# Patient Record
Sex: Female | Born: 1945
Health system: Southern US, Community
[De-identification: ages and names within clinical notes are randomized; demographics above are authoritative.]

## PROBLEM LIST (undated history)

## (undated) DIAGNOSIS — F329 Major depressive disorder, single episode, unspecified: Secondary | ICD-10-CM

## (undated) DIAGNOSIS — F32A Depression, unspecified: Secondary | ICD-10-CM

## (undated) DIAGNOSIS — I10 Essential (primary) hypertension: Secondary | ICD-10-CM

## (undated) HISTORY — PX: BREAST EXCISIONAL BIOPSY: SUR124

---

## 1998-03-12 ENCOUNTER — Other Ambulatory Visit: Admission: RE | Admit: 1998-03-12 | Discharge: 1998-03-12 | Payer: Self-pay | Admitting: Gynecology

## 1999-05-25 ENCOUNTER — Other Ambulatory Visit: Admission: RE | Admit: 1999-05-25 | Discharge: 1999-05-25 | Payer: Self-pay | Admitting: Gynecology

## 2000-07-03 ENCOUNTER — Encounter: Admission: RE | Admit: 2000-07-03 | Discharge: 2000-07-03 | Payer: Self-pay | Admitting: Gynecology

## 2000-07-03 ENCOUNTER — Encounter: Payer: Self-pay | Admitting: Gynecology

## 2000-07-31 ENCOUNTER — Other Ambulatory Visit: Admission: RE | Admit: 2000-07-31 | Discharge: 2000-07-31 | Payer: Self-pay | Admitting: Gynecology

## 2001-07-19 ENCOUNTER — Encounter: Admission: RE | Admit: 2001-07-19 | Discharge: 2001-07-19 | Payer: Self-pay | Admitting: Gynecology

## 2001-07-19 ENCOUNTER — Encounter: Payer: Self-pay | Admitting: Gynecology

## 2001-08-30 ENCOUNTER — Other Ambulatory Visit: Admission: RE | Admit: 2001-08-30 | Discharge: 2001-08-30 | Payer: Self-pay | Admitting: Gynecology

## 2002-03-20 ENCOUNTER — Encounter (INDEPENDENT_AMBULATORY_CARE_PROVIDER_SITE_OTHER): Payer: Self-pay | Admitting: *Deleted

## 2002-03-20 ENCOUNTER — Ambulatory Visit (HOSPITAL_BASED_OUTPATIENT_CLINIC_OR_DEPARTMENT_OTHER): Admission: RE | Admit: 2002-03-20 | Discharge: 2002-03-20 | Payer: Self-pay | Admitting: General Surgery

## 2002-10-09 ENCOUNTER — Encounter: Admission: RE | Admit: 2002-10-09 | Discharge: 2002-10-09 | Payer: Self-pay | Admitting: Gynecology

## 2002-10-09 ENCOUNTER — Encounter: Payer: Self-pay | Admitting: Gynecology

## 2002-10-10 ENCOUNTER — Other Ambulatory Visit: Admission: RE | Admit: 2002-10-10 | Discharge: 2002-10-10 | Payer: Self-pay | Admitting: Obstetrics and Gynecology

## 2003-10-16 ENCOUNTER — Other Ambulatory Visit: Admission: RE | Admit: 2003-10-16 | Discharge: 2003-10-16 | Payer: Self-pay | Admitting: Gynecology

## 2003-10-17 ENCOUNTER — Encounter: Admission: RE | Admit: 2003-10-17 | Discharge: 2003-10-17 | Payer: Self-pay | Admitting: Gynecology

## 2004-11-17 ENCOUNTER — Encounter: Admission: RE | Admit: 2004-11-17 | Discharge: 2004-11-17 | Payer: Self-pay | Admitting: Gynecology

## 2004-11-30 ENCOUNTER — Other Ambulatory Visit: Admission: RE | Admit: 2004-11-30 | Discharge: 2004-11-30 | Payer: Self-pay | Admitting: Gynecology

## 2005-03-14 ENCOUNTER — Emergency Department (HOSPITAL_COMMUNITY): Admission: EM | Admit: 2005-03-14 | Discharge: 2005-03-14 | Payer: Self-pay | Admitting: *Deleted

## 2005-11-23 ENCOUNTER — Encounter: Admission: RE | Admit: 2005-11-23 | Discharge: 2005-11-23 | Payer: Self-pay | Admitting: Gynecology

## 2006-12-26 ENCOUNTER — Encounter: Admission: RE | Admit: 2006-12-26 | Discharge: 2006-12-26 | Payer: Self-pay | Admitting: Gynecology

## 2008-03-12 ENCOUNTER — Encounter: Admission: RE | Admit: 2008-03-12 | Discharge: 2008-03-12 | Payer: Self-pay | Admitting: Gynecology

## 2009-03-24 ENCOUNTER — Encounter: Admission: RE | Admit: 2009-03-24 | Discharge: 2009-03-24 | Payer: Self-pay | Admitting: Gynecology

## 2010-10-29 NOTE — Op Note (Signed)
   NAME:  Deborah Stevenson, Deborah Stevenson                         ACCOUNT NO.:  [id number]   MEDICAL RECORD NO.:  [id number]                   PATIENT TYPE:  AMB   LOCATION:  DSC                                  FACILITY:  [facility]   PHYSICIAN:  Gita Kudo, M.D.              DATE OF BIRTH:  [birth year]   DATE OF PROCEDURE:  03/20/2002  DATE OF DISCHARGE:                                 OPERATIVE REPORT   PREOPERATIVE DIAGNOSES:  Squamous cell cancer, left leg.   POSTOPERATIVE DIAGNOSES:  Squamous cell cancer, left leg.   OPERATION PERFORMED:  Wide excision of squamous cell carcinoma.   SURGEON:  Gita Kudo, M.D.   ANESTHESIA:  MAC--IV sedation, local 1% Xylocaine/Marcaine without  epinephrine.   INDICATIONS FOR PROCEDURE:  The patient is a 65 year old female who had  biopsy proven squamous cell carcinoma of her left leg.  It is about 1 cm in  size.  It needs wide excision.   OPERATIVE FINDINGS:  I marked out margins of 1 cm from the center of the  lesion in all directions.  Then the area was infiltrated with Xylocaine  Marcaine mixture.  Elliptical incision made in the long axis of the leg and  the dissection carried down through the fat but not the fascia of the  muscle.  Then the wound was closed with interrupted 3-0 and 4-0 nylon  sutures and sterile absorbent compressive dressing applied.  No  complications.  Specimen marked and sent for pathology.  The patient will be  discharged to be followed as an outpatient.  No complications.   DESCRIPTION OF PROCEDURE:  Gita Kudo, M.D.    MRL/MEDQ  D:  03/20/2002  T:  03/20/2002  Job:  [id number]   cc:   Hope M. Danella Deis, M.D.   Oley Balm Georgina Pillion, M.D.  [address]  [place]  [place] [address]  Fax: [phone]

## 2011-04-25 ENCOUNTER — Other Ambulatory Visit: Payer: Self-pay | Admitting: Gynecology

## 2011-04-25 DIAGNOSIS — Z1231 Encounter for screening mammogram for malignant neoplasm of breast: Secondary | ICD-10-CM

## 2011-05-17 ENCOUNTER — Ambulatory Visit
Admission: RE | Admit: 2011-05-17 | Discharge: 2011-05-17 | Disposition: A | Discharge status: Home / Self Care | Payer: Medicare Other | Source: Ambulatory Visit | Attending: Gynecology | Admitting: Gynecology

## 2011-05-17 DIAGNOSIS — Z1231 Encounter for screening mammogram for malignant neoplasm of breast: Secondary | ICD-10-CM

## 2014-10-06 ENCOUNTER — Other Ambulatory Visit: Payer: Self-pay

## 2014-10-06 DIAGNOSIS — Z1231 Encounter for screening mammogram for malignant neoplasm of breast: Secondary | ICD-10-CM

## 2014-10-23 ENCOUNTER — Ambulatory Visit
Admission: RE | Admit: 2014-10-23 | Discharge: 2014-10-23 | Disposition: A | Discharge status: Home / Self Care | Payer: Medicare Other | Source: Ambulatory Visit

## 2014-10-23 DIAGNOSIS — Z1231 Encounter for screening mammogram for malignant neoplasm of breast: Secondary | ICD-10-CM

## 2015-08-24 ENCOUNTER — Emergency Department (HOSPITAL_BASED_OUTPATIENT_CLINIC_OR_DEPARTMENT_OTHER): Payer: Medicare Other

## 2015-08-24 ENCOUNTER — Encounter (HOSPITAL_BASED_OUTPATIENT_CLINIC_OR_DEPARTMENT_OTHER): Payer: Self-pay | Admitting: *Deleted

## 2015-08-24 ENCOUNTER — Inpatient Hospital Stay (HOSPITAL_BASED_OUTPATIENT_CLINIC_OR_DEPARTMENT_OTHER)
Admission: EM | Admit: 2015-08-24 | Discharge: 2015-08-29 | DRG: 373 | Disposition: A | Discharge status: Home / Self Care | Payer: Medicare Other | Attending: General Surgery | Admitting: General Surgery

## 2015-08-24 DIAGNOSIS — K3533 Acute appendicitis with perforation and localized peritonitis, with abscess: Secondary | ICD-10-CM

## 2015-08-24 DIAGNOSIS — Z888 Allergy status to other drugs, medicaments and biological substances status: Secondary | ICD-10-CM | POA: Diagnosis not present

## 2015-08-24 DIAGNOSIS — F329 Major depressive disorder, single episode, unspecified: Secondary | ICD-10-CM | POA: Diagnosis present

## 2015-08-24 DIAGNOSIS — R1031 Right lower quadrant pain: Secondary | ICD-10-CM | POA: Diagnosis present

## 2015-08-24 DIAGNOSIS — I10 Essential (primary) hypertension: Secondary | ICD-10-CM | POA: Diagnosis present

## 2015-08-24 DIAGNOSIS — Z882 Allergy status to sulfonamides status: Secondary | ICD-10-CM | POA: Diagnosis not present

## 2015-08-24 DIAGNOSIS — L271 Localized skin eruption due to drugs and medicaments taken internally: Secondary | ICD-10-CM | POA: Diagnosis not present

## 2015-08-24 DIAGNOSIS — Z7982 Long term (current) use of aspirin: Secondary | ICD-10-CM

## 2015-08-24 DIAGNOSIS — T373X5A Adverse effect of other antiprotozoal drugs, initial encounter: Secondary | ICD-10-CM | POA: Diagnosis not present

## 2015-08-24 DIAGNOSIS — K353 Acute appendicitis with localized peritonitis, without perforation or gangrene: Secondary | ICD-10-CM

## 2015-08-24 DIAGNOSIS — K651 Peritoneal abscess: Secondary | ICD-10-CM

## 2015-08-24 DIAGNOSIS — Z79899 Other long term (current) drug therapy: Secondary | ICD-10-CM | POA: Diagnosis not present

## 2015-08-24 DIAGNOSIS — Y92230 Patient room in hospital as the place of occurrence of the external cause: Secondary | ICD-10-CM | POA: Diagnosis not present

## 2015-08-24 HISTORY — DX: Major depressive disorder, single episode, unspecified: F32.9

## 2015-08-24 HISTORY — DX: Depression, unspecified: F32.A

## 2015-08-24 HISTORY — DX: Essential (primary) hypertension: I10

## 2015-08-24 LAB — CBC
HCT: 37.7 % (ref 36.0–46.0)
Hemoglobin: 12.5 g/dL (ref 12.0–15.0)
MCH: 29.2 pg (ref 26.0–34.0)
MCHC: 33.2 g/dL (ref 30.0–36.0)
MCV: 88.1 fL (ref 78.0–100.0)
PLATELETS: 280 10*3/uL (ref 150–400)
RBC: 4.28 MIL/uL (ref 3.87–5.11)
RDW: 13.1 % (ref 11.5–15.5)
WBC: 11.8 10*3/uL — AB (ref 4.0–10.5)

## 2015-08-24 LAB — COMPREHENSIVE METABOLIC PANEL
ALBUMIN: 3.8 g/dL (ref 3.5–5.0)
ALT: 87 U/L — AB (ref 14–54)
AST: 75 U/L — AB (ref 15–41)
Alkaline Phosphatase: 153 U/L — ABNORMAL HIGH (ref 38–126)
Anion gap: 11 (ref 5–15)
BILIRUBIN TOTAL: 0.6 mg/dL (ref 0.3–1.2)
BUN: 12 mg/dL (ref 6–20)
CO2: 26 mmol/L (ref 22–32)
CREATININE: 0.76 mg/dL (ref 0.44–1.00)
Calcium: 9.1 mg/dL (ref 8.9–10.3)
Chloride: 101 mmol/L (ref 101–111)
GFR calc Af Amer: >60 mL/min (ref >60)
GLUCOSE: 111 mg/dL — AB (ref 65–99)
Potassium: 3.9 mmol/L (ref 3.5–5.1)
Sodium: 138 mmol/L (ref 135–145)
TOTAL PROTEIN: 7.4 g/dL (ref 6.5–8.1)

## 2015-08-24 LAB — URINALYSIS, ROUTINE W REFLEX MICROSCOPIC
BILIRUBIN URINE: NEGATIVE
GLUCOSE, UA: NEGATIVE mg/dL
Hgb urine dipstick: NEGATIVE
KETONES UR: NEGATIVE mg/dL
LEUKOCYTES UA: NEGATIVE
NITRITE: NEGATIVE
PH: 7.5 (ref 5.0–8.0)
PROTEIN: NEGATIVE mg/dL
Specific Gravity, Urine: 1.009 (ref 1.005–1.030)

## 2015-08-24 LAB — LIPASE, BLOOD: Lipase: 32 U/L (ref 11–51)

## 2015-08-24 MED ORDER — PIPERACILLIN-TAZOBACTAM 3.375 G IVPB 30 MIN
3.3750 g | Freq: Once | INTRAVENOUS | Status: AC
  Administered 2015-08-24: 3.375 g via INTRAVENOUS
  Filled 2015-08-24 (×2): qty 50

## 2015-08-24 MED ORDER — KCL IN DEXTROSE-NACL 20-5-0.45 MEQ/L-%-% IV SOLN
INTRAVENOUS | Status: DC
  Administered 2015-08-24: 1000 mL via INTRAVENOUS
  Administered 2015-08-25: 12:00:00 via INTRAVENOUS
  Administered 2015-08-26: 1000 mL via INTRAVENOUS
  Administered 2015-08-26 – 2015-08-27 (×3): via INTRAVENOUS
  Administered 2015-08-28: 1000 mL via INTRAVENOUS
  Filled 2015-08-24 (×9): qty 1000

## 2015-08-24 MED ORDER — ONDANSETRON HCL 4 MG/2ML IJ SOLN
INTRAMUSCULAR | Status: AC
  Filled 2015-08-24: qty 2

## 2015-08-24 MED ORDER — IOHEXOL 350 MG/ML SOLN
85.0000 mL | Freq: Once | INTRAVENOUS | Status: AC | PRN
  Administered 2015-08-24: 85 mL via INTRAVENOUS

## 2015-08-24 MED ORDER — HYDROCODONE-ACETAMINOPHEN 5-325 MG PO TABS
1.0000 | ORAL_TABLET | ORAL | Status: DC | PRN
  Administered 2015-08-26 (×2): 1 via ORAL
  Filled 2015-08-24 (×3): qty 1

## 2015-08-24 MED ORDER — ACETAMINOPHEN 650 MG RE SUPP: 650.0000 mg | Freq: Four times a day (QID) | RECTAL | Status: DC | PRN

## 2015-08-24 MED ORDER — PIPERACILLIN-TAZOBACTAM 3.375 G IVPB
3.3750 g | Freq: Three times a day (TID) | INTRAVENOUS | Status: DC
  Administered 2015-08-24 – 2015-08-28 (×11): 3.375 g via INTRAVENOUS
  Filled 2015-08-24 (×12): qty 50

## 2015-08-24 MED ORDER — SODIUM CHLORIDE 0.9 % IV BOLUS (SEPSIS)
500.0000 mL | Freq: Once | INTRAVENOUS | Status: AC
  Administered 2015-08-24: 500 mL via INTRAVENOUS

## 2015-08-24 MED ORDER — MORPHINE SULFATE (PF) 4 MG/ML IV SOLN
4.0000 mg | Freq: Once | INTRAVENOUS | Status: AC
  Administered 2015-08-24: 4 mg via INTRAVENOUS
  Filled 2015-08-24: qty 1

## 2015-08-24 MED ORDER — ONDANSETRON 4 MG PO TBDP: 4.0000 mg | ORAL_TABLET | Freq: Four times a day (QID) | ORAL | Status: DC | PRN

## 2015-08-24 MED ORDER — ACETAMINOPHEN 325 MG PO TABS: 650.0000 mg | ORAL_TABLET | Freq: Four times a day (QID) | ORAL | Status: DC | PRN

## 2015-08-24 MED ORDER — ONDANSETRON HCL 4 MG/2ML IJ SOLN: 4.0000 mg | Freq: Four times a day (QID) | INTRAMUSCULAR | Status: DC | PRN

## 2015-08-24 MED ORDER — HYDROMORPHONE HCL 1 MG/ML IJ SOLN
1.0000 mg | INTRAMUSCULAR | Status: DC | PRN
  Administered 2015-08-24 – 2015-08-25 (×2): 1 mg via INTRAVENOUS
  Filled 2015-08-24 (×2): qty 1

## 2015-08-24 MED ORDER — ONDANSETRON HCL 4 MG/2ML IJ SOLN
4.0000 mg | Freq: Once | INTRAMUSCULAR | Status: AC
Start: 2015-08-24 — End: 2015-08-24
  Administered 2015-08-24: 4 mg via INTRAVENOUS

## 2015-08-24 NOTE — ED Notes (Signed)
RLQ x 10 days.  Tenderness on palpation.  Denies N/V/D.  BM yesterday.

## 2015-08-24 NOTE — H&P (Signed)
Deborah Stevenson is an 70 y.o. female.    General Surgery [facility]  Chief Complaint: abdominal pain, probable perforated appendicitis with abscess  HPI: patient is a 70 yo WF with 10 day history of intermittent RLQ abd pain.  Denies fever, chills, nausea, or emesis.  Pain has become more persistent and patient presented today for evaluation to primary MD.  WBC slightly elevated at 11.8.  Sent to [facility] for CT scan which shows probable acute appendicitis with inflammation of the cecum and a 4-5 cm fluid collection in the RLQ.  Now admitted to general surgery for evaluation and management.  Previous C-section and breast biopsy (by [place]).  Past Medical History  Diagnosis Date  . Hypertension   . Depression     Past Surgical History  Procedure Laterality Date  . Cesarean section      History reviewed. No pertinent family history. Social History:  reports that she has never smoked. She does not have any smokeless tobacco history on file. She reports that she does not drink alcohol or use illicit drugs.  Allergies:  Allergies  Allergen Reactions  . Lamisil [Terbinafine] Hives  . Septra [Sulfamethoxazole-Trimethoprim] Hives and Nausea Only    fever    Medications Prior to Admission  Medication Sig Dispense Refill  . aspirin 81 MG tablet Take 81 mg by mouth daily.    . Biotin 5 MG CAPS Take 5 mg by mouth 2 (two) times daily.    . Calcium Citrate-Vitamin D 500-500 MG-UNIT CHEW Chew 500 mg by mouth 2 (two) times daily.    . Chondroitin Sulfate 150 MG CAPS Take 300 mg by mouth 2 (two) times daily.    Marland Kitchen co-enzyme Q-10 30 MG capsule Take 30 mg by mouth daily.    . Glucosamine 750 MG TABS Take 750 mg by mouth 2 (two) times daily.    . magnesium oxide (MAG-OX) 400 MG tablet Take 400 mg by mouth daily.    . metoprolol succinate (TOPROL-XL) 100 MG 24 hr tablet Take 100 mg by mouth daily. Take with or immediately following a meal.    . Omega-3 Fatty Acids (FISH  OIL) 1000 MG CAPS Take 1,000 mg by mouth daily.    . Probiotic Product (PROBIOTIC PO) Take 1 tablet by mouth daily.    Marland Kitchen pyridOXINE (VITAMIN B-6) 100 MG tablet Take 100 mg by mouth 2 (two) times daily.    . sertraline (ZOLOFT) 100 MG tablet Take 100 mg by mouth daily.      Results for orders placed or performed during the hospital encounter of 08/24/15 (from the past 48 hour(s))  Urinalysis, Routine w reflex microscopic (not at [facility])     Status: None   Collection Time: 08/24/15  2:45 PM  Result Value Ref Range   Color, Urine YELLOW YELLOW   APPearance CLEAR CLEAR   Specific Gravity, Urine 1.009 1.005 - 1.030   pH 7.5 5.0 - 8.0   Glucose, UA NEGATIVE NEGATIVE mg/dL   Hgb urine dipstick NEGATIVE NEGATIVE   Bilirubin Urine NEGATIVE NEGATIVE   Ketones, ur NEGATIVE NEGATIVE mg/dL   Protein, ur NEGATIVE NEGATIVE mg/dL   Nitrite NEGATIVE NEGATIVE   Leukocytes, UA NEGATIVE NEGATIVE    Comment: MICROSCOPIC NOT DONE ON URINES WITH NEGATIVE PROTEIN, BLOOD, LEUKOCYTES, NITRITE, OR GLUCOSE <1000 mg/dL.  Lipase, blood     Status: None   Collection Time: 08/24/15  2:50 PM  Result Value Ref Range   Lipase 32 11 -  51 U/L  Comprehensive metabolic panel     Status: Abnormal   Collection Time: 08/24/15  2:50 PM  Result Value Ref Range   Sodium 138 135 - 145 mmol/L   Potassium 3.9 3.5 - 5.1 mmol/L   Chloride 101 101 - 111 mmol/L   CO2 26 22 - 32 mmol/L   Glucose, Bld 111 (H) 65 - 99 mg/dL   BUN 12 6 - 20 mg/dL   Creatinine, Ser 1.25 0.44 - 1.00 mg/dL   Calcium 9.1 8.9 - 55.7 mg/dL   Total Protein 7.4 6.5 - 8.1 g/dL   Albumin 3.8 3.5 - 5.0 g/dL   AST 75 (H) 15 - 41 U/L   ALT 87 (H) 14 - 54 U/L   Alkaline Phosphatase 153 (H) 38 - 126 U/L   Total Bilirubin 0.6 0.3 - 1.2 mg/dL   GFR calc non Af Amer >60 >60 mL/min   GFR calc Af Amer >60 >60 mL/min    Comment: (NOTE) The eGFR has been calculated using the CKD EPI equation. This calculation has not been validated in all clinical  situations. eGFR's persistently <60 mL/min signify possible Chronic Kidney Disease.    Anion gap 11 5 - 15  CBC     Status: Abnormal   Collection Time: 08/24/15  2:50 PM  Result Value Ref Range   WBC 11.8 (H) 4.0 - 10.5 K/uL   RBC 4.28 3.87 - 5.11 MIL/uL   Hemoglobin 12.5 12.0 - 15.0 g/dL   HCT 83.0 52.8 - 18.8 %   MCV 88.1 78.0 - 100.0 fL   MCH 29.2 26.0 - 34.0 pg   MCHC 33.2 30.0 - 36.0 g/dL   RDW 57.7 11.0 - 78.9 %   Platelets 280 150 - 400 K/uL   Ct Abdomen Pelvis W Contrast  08/24/2015  CLINICAL DATA:  RIGHT lower quadrant pain and tenderness for 10 days, history hypertension EXAM: CT ABDOMEN AND PELVIS WITH CONTRAST TECHNIQUE: Multidetector CT imaging of the abdomen and pelvis was performed using the standard protocol following bolus administration of intravenous contrast. Sagittal and coronal MPR images reconstructed from axial data set. CONTRAST:  40mL OMNIPAQUE IOHEXOL 350 MG/ML SOLN IV. Dilute oral contrast. COMPARISON:  None FINDINGS: Minimal dependent atelectasis in both lower lobes. Tiny nonspecific low to intermediate attenuation foci in both kidneys, nonspecific. Liver, spleen, pancreas, kidneys, and adrenal glands otherwise normal appearance. Contracted gallbladder. Inflammatory process centered at the tip of the cecum where a probable enhancing thickened appendix is identified extending into a focal inflammatory collection measuring 4.4 x 2.7 x 3.2 cm in size likely perforated appendicitis with surrounding abscess. Mild thickening of the cecal tip and adjacent small bowel. No free intraperitoneal air or fluid identified. Bladder, ureters, uterus and ovaries unremarkable. Stomach and remaining bowel loops normal appearance. No mass, adenopathy, or acute bone lesion. Degenerative disc disease changes of the thoracolumbar spine. IMPRESSION: Inflammatory process at the tip of the cecum with identification of a probable thickened enhancing appendix extending into an inflammatory  collection most consistent with perforated appendicitis with a periappendiceal abscess of measuring 4.4 x 2.7 x 3.2 cm. Electronically Signed   By: Ulyses Southward M.D.   On: 08/24/2015 17:08    Review of Systems  Constitutional: Negative for fever, chills and diaphoresis.  HENT: Negative.   Eyes: Negative.   Respiratory: Negative.   Cardiovascular: Negative.   Gastrointestinal: Positive for abdominal pain (RLQ, sharp). Negative for nausea, vomiting, diarrhea and constipation.  Genitourinary: Negative.   Musculoskeletal: Negative.  Skin: Negative.   Neurological: Negative.  Negative for weakness.  Endo/Heme/Allergies: Negative.   Psychiatric/Behavioral: Negative.     Blood pressure 118/52, pulse 65, temperature 98.2 F (36.8 C), temperature source Oral, resp. rate 17, height '5\' 5"'$  (1.651 m), weight 68.04 kg (150 lb), SpO2 95 %. Physical Exam  Constitutional: She is oriented to person, place, and time. She appears well-developed and well-nourished. No distress.  HENT:  Head: Normocephalic and atraumatic.  Right Ear: External ear normal.  Left Ear: External ear normal.  Eyes: Conjunctivae are normal. Pupils are equal, round, and reactive to light. No scleral icterus.  Neck: Normal range of motion. Neck supple. No tracheal deviation present. No thyromegaly present.  Cardiovascular: Normal rate, regular rhythm and normal heart sounds.   No murmur heard. Respiratory: Effort normal and breath sounds normal. No respiratory distress. She has no wheezes.  GI: Soft. Bowel sounds are normal. She exhibits no distension and no mass. There is tenderness (RLQ). There is guarding.  Musculoskeletal: Normal range of motion. She exhibits no edema.  Neurological: She is alert and oriented to person, place, and time.  Skin: Skin is warm and dry. She is not diaphoretic.  Psychiatric: She has a normal mood and affect. Her behavior is normal.     Assessment/Plan Probable acute appendicitis with abscess  (appendiceal neoplasm less likely)  IV Zosyn started at [place]  Will allow clear liquids, NPO after midnight for possible IR drainage in AM 3/14  IV hydration  Discussed with patient and husband who agree with plan.  Earnstine Regal, MD, [facility] Office: [place], MD 08/24/2015, 10:06 PM

## 2015-08-24 NOTE — ED Provider Notes (Signed)
CSN: [id number]     Arrival date & time 08/24/15  1429 History  By signing my name below, I, Tanda Rockers, attest that this documentation has been prepared under the direction and in the presence of Rolan Bucco, MD. Electronically Signed: Tanda Rockers, ED Scribe. 08/24/2015. 4:15 PM.   Chief Complaint  Patient presents with  . Abdominal Pain   The history is provided by the patient. No language interpreter was used.     HPI Comments: Deborah Stevenson is a 70 y.o. female who presents to the Emergency Department complaining of gradual onset, constant, waxing and waning, RLQ abdominal pain x 10 days. The pain is not worsened after eating. Pt was seen at Urgent Care this morning and was sent here for further evaluation. She reports hx of chronic constipation/hard stools. Her last bowel movement was yesterday which she describes as large and hard with a small amount of diarrhea. Denies nausea, vomiting, fever, or any other associated symptoms. PSHx cesarean section and colonoscopy (7 years ago).   PCP - Dr. Laurine Blazer  Past Medical History  Diagnosis Date  . Hypertension   . Depression    Past Surgical History  Procedure Laterality Date  . Cesarean section     History reviewed. No pertinent family history. Social History  Substance Use Topics  . Smoking status: Never Smoker   . Smokeless tobacco: None  . Alcohol Use: No   OB History    No data available     Review of Systems  Constitutional: Negative for fever, chills, diaphoresis and fatigue.  HENT: Negative for congestion, rhinorrhea and sneezing.   Eyes: Negative.   Respiratory: Negative for cough, chest tightness and shortness of breath.   Cardiovascular: Negative for chest pain and leg swelling.  Gastrointestinal: Positive for abdominal pain and diarrhea. Negative for nausea, vomiting and blood in stool. Constipation: chronic.  Genitourinary: Negative for frequency, hematuria, flank pain and difficulty urinating.   Musculoskeletal: Negative for back pain and arthralgias.  Skin: Negative for rash.  Neurological: Negative for dizziness, speech difficulty, weakness, numbness and headaches.   Allergies  Lamisil and Septra  Home Medications   Prior to Admission medications   Medication Sig Start Date End Date Taking? Authorizing Provider  metoprolol succinate (TOPROL-XL) 100 MG 24 hr tablet Take 100 mg by mouth daily. Take with or immediately following a meal.   Yes Historical Provider, MD  sertraline (ZOLOFT) 100 MG tablet Take 100 mg by mouth daily.   Yes Historical Provider, MD   BP 139/68 mmHg  Pulse 72  Temp(Src) 98.8 F (37.1 C)  Resp 18  Ht  (1.651 m)  Wt 150 lb (68.04 kg)  BMI 24.96 kg/m2  SpO2 100%   Physical Exam  Constitutional: She is oriented to person, place, and time. She appears well-developed and well-nourished.  HENT:  Head: Normocephalic and atraumatic.  Eyes: Pupils are equal, round, and reactive to light.  Neck: Normal range of motion. Neck supple.  Cardiovascular: Normal rate, regular rhythm and normal heart sounds.   Pulmonary/Chest: Effort normal and breath sounds normal. No respiratory distress. She has no wheezes. She has no rales. She exhibits no tenderness.  Abdominal: Soft. Bowel sounds are normal. There is tenderness (Positive moderate tenderness of the right lower quadrant with guarding). There is no rebound and no guarding.  Musculoskeletal: Normal range of motion. She exhibits no edema.  Lymphadenopathy:    She has no cervical adenopathy.  Neurological: She is alert and oriented to person, place,  and time.  Skin: Skin is warm and dry. No rash noted.  Psychiatric: She has a normal mood and affect.    ED Course  Procedures (including critical care time)  DIAGNOSTIC STUDIES: Oxygen Saturation is 100% on RA, normal by my interpretation.    COORDINATION OF CARE: 4:10 PM-Discussed treatment plan which includes CT A/P with pt at bedside and pt agreed  to plan.   Labs Review Labs Reviewed  COMPREHENSIVE METABOLIC PANEL - Abnormal; Notable for the following:    Glucose, Bld 111 (*)    AST 75 (*)    ALT 87 (*)    Alkaline Phosphatase 153 (*)    All other components within normal limits  CBC - Abnormal; Notable for the following:    WBC 11.8 (*)    All other components within normal limits  LIPASE, BLOOD  URINALYSIS, ROUTINE W REFLEX MICROSCOPIC (NOT AT [facility])    Imaging Review Ct Abdomen Pelvis W Contrast  08/24/2015  CLINICAL DATA:  RIGHT lower quadrant pain and tenderness for 10 days, history hypertension EXAM: CT ABDOMEN AND PELVIS WITH CONTRAST TECHNIQUE: Multidetector CT imaging of the abdomen and pelvis was performed using the standard protocol following bolus administration of intravenous contrast. Sagittal and coronal MPR images reconstructed from axial data set. CONTRAST:  85mL OMNIPAQUE IOHEXOL 350 MG/ML SOLN IV. Dilute oral contrast. COMPARISON:  None FINDINGS: Minimal dependent atelectasis in both lower lobes. Tiny nonspecific low to intermediate attenuation foci in both kidneys, nonspecific. Liver, spleen, pancreas, kidneys, and adrenal glands otherwise normal appearance. Contracted gallbladder. Inflammatory process centered at the tip of the cecum where a probable enhancing thickened appendix is identified extending into a focal inflammatory collection measuring 4.4 x 2.7 x 3.2 cm in size likely perforated appendicitis with surrounding abscess. Mild thickening of the cecal tip and adjacent small bowel. No free intraperitoneal air or fluid identified. Bladder, ureters, uterus and ovaries unremarkable. Stomach and remaining bowel loops normal appearance. No mass, adenopathy, or acute bone lesion. Degenerative disc disease changes of the thoracolumbar spine. IMPRESSION: Inflammatory process at the tip of the cecum with identification of a probable thickened enhancing appendix extending into an inflammatory collection most consistent with  perforated appendicitis with a periappendiceal abscess of measuring 4.4 x 2.7 x 3.2 cm. Electronically Signed   By: Ulyses SouthwardMark  Boles M.D.   On: 08/24/2015 17:08   I have personally reviewed and evaluated these images and lab results as part of my medical decision-making.   EKG Interpretation None      MDM   Final diagnoses:  Acute appendicitis with localized peritonitis   Patient presents with worsening right lower quadrant pain over the last 10 days. CT scan shows acute appendicitis with adjacent abscess. Patient's vital signs are stable without suggestions of sepsis. I spoke with Dr. Gerrit FriendsGerkin who [facility]. Patient was started on Zosyn.  I personally performed the services described in this documentation, which was scribed in my presence.  The recorded information has been reviewed and considered.      Rolan BuccoMelanie Eveleen Mcnear, MD 08/24/15 [phone]

## 2015-08-25 LAB — SURGICAL PCR SCREEN
MRSA, PCR: NEGATIVE
Staphylococcus aureus: NEGATIVE

## 2015-08-25 MED ORDER — ENOXAPARIN SODIUM 40 MG/0.4ML ~~LOC~~ SOLN
40.0000 mg | Freq: Every day | SUBCUTANEOUS | Status: DC
  Administered 2015-08-25 – 2015-08-29 (×5): 40 mg via SUBCUTANEOUS
  Filled 2015-08-25 (×6): qty 0.4

## 2015-08-25 MED ORDER — SERTRALINE HCL 100 MG PO TABS
100.0000 mg | ORAL_TABLET | Freq: Every day | ORAL | Status: DC
  Administered 2015-08-25 – 2015-08-29 (×5): 100 mg via ORAL
  Filled 2015-08-25 (×5): qty 1

## 2015-08-25 MED ORDER — METOPROLOL SUCCINATE ER 50 MG PO TB24
50.0000 mg | ORAL_TABLET | Freq: Two times a day (BID) | ORAL | Status: DC
  Administered 2015-08-27: 50 mg via ORAL
  Filled 2015-08-25 (×8): qty 1

## 2015-08-25 NOTE — Progress Notes (Signed)
Subjective: She looks fine, still tender RLQ, anxious to get things done and move on.    Objective: Vital signs in last 24 hours: Temp:  [98.2 F (36.8 C)-100.2 F (37.9 C)] 100.2 F (37.9 C) (03/14 0609) Pulse Rate:  [64-80] 65 (03/14 0609) Resp:  [17-18] 18 (03/14 0609) BP: (99-145)/(52-81) 99/56 mmHg (03/14 0609) SpO2:  [93 %-100 %] 93 % (03/14 0609) Weight:  [68.04 kg (150 lb)] 68.04 kg (150 lb) (03/13 1439) Last BM Date: 08/23/15 240 PO recorded 400 urine,  NPO Temp 100.2 at 0300, nothing recorded since.   No labs this AM Radiology has evaluated and does not think this is an abscess yet. Intake/Output from previous day: 03/13 0701 - 03/14 0700 In: 971.7 [P.O.:240; I.V.:731.7] Out: 400 [Urine:400] Intake/Output this shift:    General appearance: alert, cooperative, no distress and sore RLQ Resp: clear to auscultation bilaterally GI: soft, sore RLQ.  Lab Results:   Recent Labs  08/24/15 1450  WBC 11.8*  HGB 12.5  HCT 37.7  PLT 280    BMET  Recent Labs  08/24/15 1450  NA 138  K 3.9  CL 101  CO2 26  GLUCOSE 111*  BUN 12  CREATININE 0.76  CALCIUM 9.1   PT/INR No results for input(s): LABPROT, INR in the last 72 hours.   Recent Labs Lab 08/24/15 1450  AST 75*  ALT 87*  ALKPHOS 153*  BILITOT 0.6  PROT 7.4  ALBUMIN 3.8     Lipase     Component Value Date/Time   LIPASE 32 08/24/2015 1450     Studies/Results: Ct Abdomen Pelvis W Contrast  08/24/2015  CLINICAL DATA:  RIGHT lower quadrant pain and tenderness for 10 days, history hypertension EXAM: CT ABDOMEN AND PELVIS WITH CONTRAST TECHNIQUE: Multidetector CT imaging of the abdomen and pelvis was performed using the standard protocol following bolus administration of intravenous contrast. Sagittal and coronal MPR images reconstructed from axial data set. CONTRAST:  85mL OMNIPAQUE IOHEXOL 350 MG/ML SOLN IV. Dilute oral contrast. COMPARISON:  None FINDINGS: Minimal dependent atelectasis in  both lower lobes. Tiny nonspecific low to intermediate attenuation foci in both kidneys, nonspecific. Liver, spleen, pancreas, kidneys, and adrenal glands otherwise normal appearance. Contracted gallbladder. Inflammatory process centered at the tip of the cecum where a probable enhancing thickened appendix is identified extending into a focal inflammatory collection measuring 4.4 x 2.7 x 3.2 cm in size likely perforated appendicitis with surrounding abscess. Mild thickening of the cecal tip and adjacent small bowel. No free intraperitoneal air or fluid identified. Bladder, ureters, uterus and ovaries unremarkable. Stomach and remaining bowel loops normal appearance. No mass, adenopathy, or acute bone lesion. Degenerative disc disease changes of the thoracolumbar spine. IMPRESSION: Inflammatory process at the tip of the cecum with identification of a probable thickened enhancing appendix extending into an inflammatory collection most consistent with perforated appendicitis with a periappendiceal abscess of measuring 4.4 x 2.7 x 3.2 cm. Electronically Signed   By: Ulyses SouthwardMark  Boles M.D.   On: 08/24/2015 17:08    Medications: . piperacillin-tazobactam (ZOSYN)  IV  3.375 g Intravenous 3 times per day   . dextrose 5 % and 0.45 % NaCl with KCl 20 mEq/L 1,000 mL (08/24/15 2232)   Prior to Admission medications   Medication Sig Start Date End Date Taking? Authorizing Provider  aspirin 81 MG tablet Take 81 mg by mouth daily.   Yes Historical Provider, MD  Biotin 5 MG CAPS Take 5 mg by mouth 2 (two) times daily.  Yes Historical Provider, MD  Calcium Citrate-Vitamin D 500-500 MG-UNIT CHEW Chew 500 mg by mouth 2 (two) times daily.   Yes Historical Provider, MD  Chondroitin Sulfate 150 MG CAPS Take 300 mg by mouth 2 (two) times daily.   Yes Historical Provider, MD  co-enzyme Q-10 30 MG capsule Take 30 mg by mouth daily.   Yes Historical Provider, MD  Glucosamine 750 MG TABS Take 750 mg by mouth 2 (two) times daily.    Yes Historical Provider, MD  magnesium oxide (MAG-OX) 400 MG tablet Take 400 mg by mouth daily.   Yes Historical Provider, MD  metoprolol succinate (TOPROL-XL) 100 MG 24 hr tablet Take 100 mg by mouth daily. Take with or immediately following a meal.   Yes Historical Provider, MD  Omega-3 Fatty Acids (FISH OIL) 1000 MG CAPS Take 1,000 mg by mouth daily.   Yes Historical Provider, MD  Probiotic Product (PROBIOTIC PO) Take 1 tablet by mouth daily.   Yes Historical Provider, MD  pyridOXINE (VITAMIN B-6) 100 MG tablet Take 100 mg by mouth 2 (two) times daily.   Yes Historical Provider, MD  sertraline (ZOLOFT) 100 MG tablet Take 100 mg by mouth daily.   Yes Historical Provider, MD    Assessment/Plan Probable acute appendicitis with abscess (appendiceal neoplasm less likely Hx of hypertension Hx of depression  Antibiotics:  Zosyn day 1.5 DVT:  SCD/Lovenox  Plan:  IR does not think this is currently is a good drainable site.  Continue IV antibiotics, I will put her on sips and ice chips.  Restart BB and Zoloft.  Recheck labs in AM/  Add lovenox        LOS: 1 day    Claramae Rigdon 08/25/2015

## 2015-08-25 NOTE — Progress Notes (Signed)
ANTICOAGULATION CONSULT NOTE - Initial Consult  Pharmacy Consult for enoxaparin Indication: VTE prophylaxis  Allergies  Allergen Reactions  . Lamisil [Terbinafine] Hives  . Septra [Sulfamethoxazole-Trimethoprim] Hives and Nausea Only    fever    Patient Measurements: Height: 5\' 5"  (165.1 cm) Weight: 150 lb (68.04 kg) IBW/kg (Calculated) : 57 Heparin Dosing Weight:   Vital Signs: Temp: 100.2 F (37.9 C) (03/14 0609) Temp Source: Oral (03/14 0609) BP: 99/56 mmHg (03/14 0609) Pulse Rate: 65 (03/14 0609)  Labs:  Recent Labs  08/24/15 1450  HGB 12.5  HCT 37.7  PLT 280  CREATININE 0.76    Estimated Creatinine Clearance: 58 mL/min (by C-G formula based on Cr of 0.76).   Medical History: Past Medical History  Diagnosis Date  . Hypertension   . Depression     Assessment: [age] YOF presents with abdominal pain and likely acute appendicitis w/ abscess.  No current plans for procedure per notes.  Renal: CrCl > 6130ml/min CBC: WNL  Goal of Therapy:  Anti-Xa level 0.3-0.6 units/ml 4hrs after LMWH dose given  Plan:   Enoxaparin 40mg  SQ q24h  Do not anticipate need for renal adjustment, sign-off  Weekly SCr  Juliette Alcideustin Zeigler, PharmD, BCPS.   Pager: [phone] 08/25/2015 10:51 AM

## 2015-08-26 LAB — BASIC METABOLIC PANEL
Anion gap: 7 (ref 5–15)
BUN: 7 mg/dL (ref 6–20)
CO2: 24 mmol/L (ref 22–32)
CREATININE: 0.89 mg/dL (ref 0.44–1.00)
Calcium: 8.3 mg/dL — ABNORMAL LOW (ref 8.9–10.3)
Chloride: 106 mmol/L (ref 101–111)
GFR calc Af Amer: >60 mL/min (ref >60)
GLUCOSE: 143 mg/dL — AB (ref 65–99)
POTASSIUM: 4.3 mmol/L (ref 3.5–5.1)
SODIUM: 137 mmol/L (ref 135–145)

## 2015-08-26 LAB — CBC
HCT: 28.3 % — ABNORMAL LOW (ref 36.0–46.0)
Hemoglobin: 9.4 g/dL — ABNORMAL LOW (ref 12.0–15.0)
MCH: 28.8 pg (ref 26.0–34.0)
MCHC: 33.2 g/dL (ref 30.0–36.0)
MCV: 86.8 fL (ref 78.0–100.0)
PLATELETS: 194 10*3/uL (ref 150–400)
RBC: 3.26 MIL/uL — AB (ref 3.87–5.11)
RDW: 13 % (ref 11.5–15.5)
WBC: 8.5 10*3/uL (ref 4.0–10.5)

## 2015-08-26 NOTE — Progress Notes (Signed)
Subjective: She is still very tender RLQ, no nausea or vomiting.  Enjoying clears.    Objective: Vital signs in last 24 hours: Temp:  [98.4 F (36.9 C)-99.1 F (37.3 C)] 98.7 F (37.1 C) (03/15 0615) Pulse Rate:  [60-71] 60 (03/15 0615) Resp:  [17-18] 18 (03/15 0615) BP: (99-105)/(45-53) 99/53 mmHg (03/15 0615) SpO2:  [97 %-99 %] 98 % (03/15 0615) Last BM Date: 08/23/15 840 PO  Diet: clears   2600 urine   Afebrile, VSS BP is rather low Labs OK WBC is down also Intake/Output from previous day: 03/14 0701 - 03/15 0700 In: 3210 [P.O.:840; I.V.:2370] Out: 2600 [Urine:2600] Intake/Output this shift:    General appearance: alert, cooperative and no distress GI: soft, still pretty tender RLQ.  Lab Results:   Recent Labs  08/24/15 1450 08/26/15 0439  WBC 11.8* 8.5  HGB 12.5 9.4*  HCT 37.7 28.3*  PLT 280 194    BMET  Recent Labs  08/24/15 1450 08/26/15 0439  NA 138 137  K 3.9 4.3  CL 101 106  CO2 26 24  GLUCOSE 111* 143*  BUN 12 7  CREATININE 0.76 0.89  CALCIUM 9.1 8.3*   PT/INR No results for input(s): LABPROT, INR in the last 72 hours.   Recent Labs Lab 08/24/15 1450  AST 75*  ALT 87*  ALKPHOS 153*  BILITOT 0.6  PROT 7.4  ALBUMIN 3.8     Lipase     Component Value Date/Time   LIPASE 32 08/24/2015 1450     Studies/Results: Ct Abdomen Pelvis W Contrast  08/24/2015  CLINICAL DATA:  RIGHT lower quadrant pain and tenderness for 10 days, history hypertension EXAM: CT ABDOMEN AND PELVIS WITH CONTRAST TECHNIQUE: Multidetector CT imaging of the abdomen and pelvis was performed using the standard protocol following bolus administration of intravenous contrast. Sagittal and coronal MPR images reconstructed from axial data set. CONTRAST:  85mL OMNIPAQUE IOHEXOL 350 MG/ML SOLN IV. Dilute oral contrast. COMPARISON:  None FINDINGS: Minimal dependent atelectasis in both lower lobes. Tiny nonspecific low to intermediate attenuation foci in both kidneys,  nonspecific. Liver, spleen, pancreas, kidneys, and adrenal glands otherwise normal appearance. Contracted gallbladder. Inflammatory process centered at the tip of the cecum where a probable enhancing thickened appendix is identified extending into a focal inflammatory collection measuring 4.4 x 2.7 x 3.2 cm in size likely perforated appendicitis with surrounding abscess. Mild thickening of the cecal tip and adjacent small bowel. No free intraperitoneal air or fluid identified. Bladder, ureters, uterus and ovaries unremarkable. Stomach and remaining bowel loops normal appearance. No mass, adenopathy, or acute bone lesion. Degenerative disc disease changes of the thoracolumbar spine. IMPRESSION: Inflammatory process at the tip of the cecum with identification of a probable thickened enhancing appendix extending into an inflammatory collection most consistent with perforated appendicitis with a periappendiceal abscess of measuring 4.4 x 2.7 x 3.2 cm. Electronically Signed   By: Ulyses SouthwardMark  Boles M.D.   On: 08/24/2015 17:08    Medications: . enoxaparin (LOVENOX) injection  40 mg Subcutaneous Daily  . metoprolol succinate  50 mg Oral Q12H  . piperacillin-tazobactam (ZOSYN)  IV  3.375 g Intravenous 3 times per day  . sertraline  100 mg Oral Daily    Assessment/Plan Probable acute appendicitis with abscess (appendiceal neoplasm less likely) Hx of hypertension Hx of depression  Antibiotics: Zosyn day 2.5 DVT: SCD/Lovenox  Plan:  Toprol held yesterday due to BP, continue clears for now.  Continue antibiotics.      LOS: 2 days  Lunette Tapp 08/26/2015

## 2015-08-27 NOTE — Progress Notes (Signed)
  Subjective: Not really any better than at admission, but no worse.  She was sleeping well and is comfortable sitting still in bed.    Objective: Vital signs in last 24 hours: Temp:  [98 F (36.7 C)-99.1 F (37.3 C)] 99.1 F (37.3 C) (03/16 0453) Pulse Rate:  [62-71] 71 (03/16 0453) Resp:  [16-18] 18 (03/16 0453) BP: (108-143)/(52-69) 112/52 mmHg (03/16 0453) SpO2:  [97 %-98 %] 97 % (03/16 0453) Last BM Date: 08/23/15 360 PO recorded  Diet: clears Urine 3850 Afebrile, VSS Labs OK Intake/Output from previous day: 03/15 0701 - 03/16 0700 In: 2659.6 [P.O.:360; I.V.:1899.6; IV Piggyback:400] Out: 3850 [Urine:3850] Intake/Output this shift:    General appearance: alert, cooperative and no distress GI: soft, tender RLQ, no real change from yesterday  Lab Results:   Recent Labs  08/24/15 1450 08/26/15 0439  WBC 11.8* 8.5  HGB 12.5 9.4*  HCT 37.7 28.3*  PLT 280 194    BMET  Recent Labs  08/24/15 1450 08/26/15 0439  NA 138 137  K 3.9 4.3  CL 101 106  CO2 26 24  GLUCOSE 111* 143*  BUN 12 7  CREATININE 0.76 0.89  CALCIUM 9.1 8.3*   PT/INR No results for input(s): LABPROT, INR in the last 72 hours.   Recent Labs Lab 08/24/15 1450  AST 75*  ALT 87*  ALKPHOS 153*  BILITOT 0.6  PROT 7.4  ALBUMIN 3.8     Lipase     Component Value Date/Time   LIPASE 32 08/24/2015 1450     Studies/Results: No results found.  Medications: . enoxaparin (LOVENOX) injection  40 mg Subcutaneous Daily  . metoprolol succinate  50 mg Oral Q12H  . piperacillin-tazobactam (ZOSYN)  IV  3.375 g Intravenous 3 times per day  . sertraline  100 mg Oral Daily    Assessment/Plan Probable acute appendicitis with abscess (appendiceal neoplasm less likely) Hx of hypertension Hx of depression  Antibiotics: Zosyn day 4 DVT: SCD/Lovenox   Plan: continue antibiotics and IV fluids, clears.  We will get a CT later today and recheck labs in the AM.    LOS: 3 days     Deborah Stevenson 08/27/2015

## 2015-08-27 NOTE — Care Management Important Message (Signed)
Important Message  Patient Details  Name: Deborah Stevenson MRN: [id number] Date of Birth: 06/15/1945   Medicare Important Message Given:  Yes    Haskell FlirtJamison, Layson Bertsch 08/27/2015, 9:16 AMImportant Message  Patient Details  Name: Deborah Stevenson MRN: [id number] Date of Birth: 11/21/1945   Medicare Important Message Given:  Yes    Haskell FlirtJamison, Adelayde Minney 08/27/2015, 9:16 AM

## 2015-08-28 LAB — CBC
HEMATOCRIT: 30.5 % — AB (ref 36.0–46.0)
HEMOGLOBIN: 10.3 g/dL — AB (ref 12.0–15.0)
MCH: 28.8 pg (ref 26.0–34.0)
MCHC: 33.8 g/dL (ref 30.0–36.0)
MCV: 85.2 fL (ref 78.0–100.0)
Platelets: 233 10*3/uL (ref 150–400)
RBC: 3.58 MIL/uL — AB (ref 3.87–5.11)
RDW: 12.8 % (ref 11.5–15.5)
WBC: 4.9 10*3/uL (ref 4.0–10.5)

## 2015-08-28 LAB — BASIC METABOLIC PANEL
ANION GAP: 9 (ref 5–15)
CHLORIDE: 108 mmol/L (ref 101–111)
CO2: 23 mmol/L (ref 22–32)
Calcium: 8.8 mg/dL — ABNORMAL LOW (ref 8.9–10.3)
Creatinine, Ser: 0.73 mg/dL (ref 0.44–1.00)
GFR calc Af Amer: >60 mL/min (ref >60)
GLUCOSE: 112 mg/dL — AB (ref 65–99)
POTASSIUM: 4.3 mmol/L (ref 3.5–5.1)
Sodium: 140 mmol/L (ref 135–145)

## 2015-08-28 MED ORDER — CIPROFLOXACIN HCL 500 MG PO TABS
500.0000 mg | ORAL_TABLET | Freq: Two times a day (BID) | ORAL | Status: DC
  Administered 2015-08-28 – 2015-08-29 (×3): 500 mg via ORAL
  Filled 2015-08-28 (×5): qty 1

## 2015-08-28 MED ORDER — METRONIDAZOLE 500 MG PO TABS
500.0000 mg | ORAL_TABLET | Freq: Four times a day (QID) | ORAL | Status: DC
  Administered 2015-08-28 (×3): 500 mg via ORAL
  Filled 2015-08-28 (×8): qty 1

## 2015-08-28 MED ORDER — METOPROLOL TARTRATE 12.5 MG HALF TABLET
12.5000 mg | ORAL_TABLET | Freq: Two times a day (BID) | ORAL | Status: DC
  Administered 2015-08-28 – 2015-08-29 (×3): 12.5 mg via ORAL
  Filled 2015-08-28 (×4): qty 1

## 2015-08-28 NOTE — Progress Notes (Signed)
  Subjective: She is still a little tender RLQ, but much better.    Objective: Vital signs in last 24 hours: Temp:  [97.7 F (36.5 C)-99.1 F (37.3 C)] 97.7 F (36.5 C) (03/17 0506) Pulse Rate:  [61-70] 61 (03/17 0506) Resp:  [16-18] 16 (03/17 0506) BP: (96-133)/(56-73) 126/70 mmHg (03/17 0506) SpO2:  [98 %-100 %] 98 % (03/17 0506) Last BM Date: 08/27/15 240 PO recorded   Full liquids   Urine 3800 BM x 2 Afebrile, VSS  - she has only got one dose of the BB here so far Labs look good, WBC down to 4.9 Intake/Output from previous day: 03/16 0701 - 03/17 0700 In: 2043.8 [P.O.:240; I.V.:1753.8; IV Piggyback:50] Out: 3800 [Urine:3800] Intake/Output this shift:    General appearance: alert, cooperative and no distress GI: soft, still a little tender RLQ but better much better than yesterday when I saw her.  Lab Results:   Recent Labs  08/26/15 0439 08/28/15 0407  WBC 8.5 4.9  HGB 9.4* 10.3*  HCT 28.3* 30.5*  PLT 194 233    BMET  Recent Labs  08/26/15 0439 08/28/15 0407  NA 137 140  K 4.3 4.3  CL 106 108  CO2 24 23  GLUCOSE 143* 112*  BUN 7 <5*  CREATININE 0.89 0.73  CALCIUM 8.3* 8.8*   PT/INR No results for input(s): LABPROT, INR in the last 72 hours.   Recent Labs Lab 08/24/15 1450  AST 75*  ALT 87*  ALKPHOS 153*  BILITOT 0.6  PROT 7.4  ALBUMIN 3.8     Lipase     Component Value Date/Time   LIPASE 32 08/24/2015 1450     Studies/Results: No results found.  Medications: . enoxaparin (LOVENOX) injection  40 mg Subcutaneous Daily  . metoprolol succinate  50 mg Oral Q12H  . piperacillin-tazobactam (ZOSYN)  IV  3.375 g Intravenous 3 times per day  . sertraline  100 mg Oral Daily    Assessment/Plan Probable acute appendicitis with abscess (appendiceal neoplasm less likely) Hx of hypertension Hx of depression  Antibiotics: Zosyn day 4 DVT: SCD/Lovenox     Plan:  I have decreased her BB to 1/4 her home dose just to keep her on the  BB. Her pain is much better, and tolerating full liquids well.  I will check on increasing diet and when we need to consider interval appendectomy.      LOS: 4 days    Jhayden Demuro 08/28/2015

## 2015-08-28 NOTE — Care Management Note (Signed)
Case Management Note  Patient Details  Name: Deborah Stevenson MRN: [id number] Date of Birth: 09/29/1945  Subjective/Objective:    Admitted with Probable acute appendicitis with abscess                 Action/Plan: Discharge planning, no HH needs identified, patient has PCP to follow up with regarding HTN issues.   Expected Discharge Date:                  Expected Discharge Plan:  Home/Self Care  In-House Referral:  NA  Discharge planning Services  CM Consult  Post Acute Care Choice:  NA Choice offered to:  NA  DME Arranged:  N/A DME Agency:  NA  HH Arranged:  NA HH Agency:  NA  Status of Service:  Completed, signed off  Medicare Important Message Given:  Yes Date Medicare IM Given:    Medicare IM give by:    Date Additional Medicare IM Given:    Additional Medicare Important Message give by:     If discussed at Long Length of Stay Meetings, dates discussed:    Additional Comments:  Alexis Goodelleele, Lynda Wanninger K, RN 08/28/2015, 11:18 AM [phone]

## 2015-08-29 MED ORDER — DIPHENHYDRAMINE HCL 25 MG PO CAPS
25.0000 mg | ORAL_CAPSULE | ORAL | Status: DC | PRN
  Filled 2015-08-29: qty 1

## 2015-08-29 MED ORDER — AMOXICILLIN-POT CLAVULANATE 875-125 MG PO TABS: 1.0000 | ORAL_TABLET | Freq: Two times a day (BID) | ORAL | Status: AC

## 2015-08-29 NOTE — Progress Notes (Signed)
Patient ID: Deborah Stevenson, female   DOB: 06/21/1945, 70 y.o.   MRN: [id number]    Subjective: Had itching  And slight rash on legs last night about an hour after taking Flagyl. Resolved with Benadryl. No complaints this morning.  Denies abdominal pain  Or nausea. Tolerating diet.  Objective: Vital signs in last 24 hours: Temp:  [97.6 F (36.4 C)-98.8 F (37.1 C)] 98.6 F (37 C) (03/18 0509) Pulse Rate:  [63-70] 65 (03/18 0509) Resp:  [16] 16 (03/18 0509) BP: (120-124)/(54-79) 120/67 mmHg (03/18 0509) SpO2:  [95 %-98 %] 95 % (03/18 0509) Last BM Date: 08/27/15  Intake/Output from previous day: 03/17 0701 - 03/18 0700 In: 760.5 [P.O.:240; I.V.:520.5] Out: 2450 [Urine:2450] Intake/Output this shift:    General appearance: alert, cooperative and no distress GI: normal findings: soft, non-tender and no mass palpable  Lab Results:   Recent Labs  08/28/15 0407  WBC 4.9  HGB 10.3*  HCT 30.5*  PLT 233   BMET  Recent Labs  08/28/15 0407  NA 140  K 4.3  CL 108  CO2 23  GLUCOSE 112*  BUN <5*  CREATININE 0.73  CALCIUM 8.8*     Studies/Results: No results found.  Anti-infectives: Anti-infectives    Start     Dose/Rate Route Frequency Ordered Stop   08/28/15 1200  ciprofloxacin (CIPRO) tablet 500 mg     500 mg Oral 2 times daily 08/28/15 1111     08/28/15 1200  metroNIDAZOLE (FLAGYL) tablet 500 mg     500 mg Oral 4 times per day 08/28/15 1111     08/24/15 2230  piperacillin-tazobactam (ZOSYN) IVPB 3.375 g  Status:  Discontinued     3.375 g 12.5 mL/hr over 240 Minutes Intravenous 3 times per day 08/24/15 2215 08/28/15 1111   08/24/15 1900  piperacillin-tazobactam (ZOSYN) IVPB 3.375 g     3.375 g 100 mL/hr over 30 Minutes Intravenous  Once 08/24/15 1852 08/24/15 2028      Assessment/Plan: Appendicitis with phlegmon.  Treated nonoperatively with IV antibiotics with improvement. Okay for discharge. We'll switch to Augmentin orally due to reaction to  Flagyl. Follow-up in 2 weeks with repeat CT scan.     LOS: 5 days    Apoorva Bugay T 08/29/2015

## 2015-08-29 NOTE — Discharge Instructions (Signed)
[facility] ______[facility] INSTRUCTIONS Take antibiotics until prescription complete.  Start regular diet gradually. No activity limitations.   1. Fever over 101.0 2. Increasing abdominal pain  The clinic staff is available to answer your questions during regular business hours.  Please dont hesitate to call and ask to speak to one of the nurses for clinical concerns.  If you have a medical emergency, go to the nearest emergency room or call 911.  A surgeon from [facility] is always on call at the hospital. [address], [address], [place], [place]  [address] ? [address] [address], [place], [place]   [address] [phone] ? [phone] ? FAX [phone] Web site: [email or web address]

## 2015-08-29 NOTE — Discharge Summary (Signed)
   Patient ID: Deborah Stevenson [id number] 70 y.o. 03/03/1946  08/24/2015  Discharge date and time: 08/29/2015   Admitting Physician: Lendon Kahomas, Alecia  Discharge Physician: Glenna FellowsHOXWORTH,Godwin Tedesco T  Admission Diagnoses: Acute appendicitis with localized peritonitis [K35.3]  Discharge Diagnoses: same  Operations: None  Admission Condition: fair  Discharged Condition: good  Indication for Admission: Patient is a 70 year old female admitted with lower abdominal pain. Workup included a CT scan showing evidence of complicated appendicitiswith phlegmon. With this finding  Decision was made to treat nonoperatively with antibiotics.  She was treated initially with IV  Zosyn and later switched to IV Cipro plus Flagyl.  Hospital Course: She steadily improved with this course of treatment with  reducedpain and tenderness. White blood count normalized.Diet was able to be advanced. On the day of discharge she has no abdominal pain. Abdomen is nontender. No palpable mass. Normal white blood count. She did have an apparent allergic reaction to Flagyl  And will be discharged home on oral Augmentin.   Treatments: antibiotics: Zosyn  Disposition: Home  Patient Instructions:    Medication List    TAKE these medications        amoxicillin-clavulanate 875-125 MG tablet  Commonly known as:  AUGMENTIN  Take 1 tablet by mouth 2 (two) times daily.     aspirin 81 MG tablet  Take 81 mg by mouth daily.     Biotin 5 MG Caps  Take 5 mg by mouth 2 (two) times daily.     Calcium Citrate-Vitamin D 500-500 MG-UNIT Chew  Chew 500 mg by mouth 2 (two) times daily.     Chondroitin Sulfate 150 MG Caps  Take 300 mg by mouth 2 (two) times daily.     co-enzyme Q-10 30 MG capsule  Take 30 mg by mouth daily.     Fish Oil 1000 MG Caps  Take 1,000 mg by mouth daily.     Glucosamine 750 MG Tabs  Take 750 mg by mouth 2 (two) times daily.     magnesium oxide 400 MG tablet  Commonly known as:  MAG-OX  Take 400  mg by mouth daily.     metoprolol succinate 100 MG 24 hr tablet  Commonly known as:  TOPROL-XL  Take 100 mg by mouth daily. Take with or immediately following a meal.     PROBIOTIC PO  Take 1 tablet by mouth daily.     pyridOXINE 100 MG tablet  Commonly known as:  VITAMIN B-6  Take 100 mg by mouth 2 (two) times daily.     sertraline 100 MG tablet  Commonly known as:  ZOLOFT  Take 100 mg by mouth daily.        Activity: activity as tolerated Diet: regular diet Wound Care: none needed  Follow-up:  With Dr. Maisie Fushomas in 2 weeks.  Repeat CT scan planned  At that time.  Signed: Mariella SaaBenjamin T Dishon Kehoe MD, FACS  08/29/2015, 9:23 AM

## 2015-08-29 NOTE — Progress Notes (Signed)
Patient had an episode of reaction to some medication, flagyl or cipro.  Woke up around 0400, with itching and welts on rashes on her arms, back, abdomen, and legs.  We called MD but no call back received. After an hour patient went to sleep.  Patient did not have any further complaints at this time.

## 2015-09-17 ENCOUNTER — Other Ambulatory Visit: Payer: Self-pay | Admitting: General Surgery

## 2015-09-17 DIAGNOSIS — K3532 Acute appendicitis with perforation and localized peritonitis, without abscess: Secondary | ICD-10-CM

## 2015-09-24 ENCOUNTER — Ambulatory Visit
Admission: RE | Admit: 2015-09-24 | Discharge: 2015-09-24 | Disposition: A | Discharge status: Home / Self Care | Payer: Medicare Other | Source: Ambulatory Visit | Attending: General Surgery | Admitting: General Surgery

## 2015-09-24 DIAGNOSIS — K3532 Acute appendicitis with perforation and localized peritonitis, without abscess: Secondary | ICD-10-CM

## 2015-09-24 MED ORDER — IOPAMIDOL (ISOVUE-300) INJECTION 61%
100.0000 mL | Freq: Once | INTRAVENOUS | Status: AC | PRN
  Administered 2015-09-24: 100 mL via INTRAVENOUS

## 2015-10-12 ENCOUNTER — Other Ambulatory Visit: Payer: Self-pay

## 2015-10-12 DIAGNOSIS — Z1231 Encounter for screening mammogram for malignant neoplasm of breast: Secondary | ICD-10-CM

## 2015-11-23 ENCOUNTER — Ambulatory Visit
Admission: RE | Admit: 2015-11-23 | Discharge: 2015-11-23 | Disposition: A | Discharge status: Home / Self Care | Payer: Medicare Other | Source: Ambulatory Visit

## 2015-11-23 DIAGNOSIS — Z1231 Encounter for screening mammogram for malignant neoplasm of breast: Secondary | ICD-10-CM

## 2017-05-06 IMAGING — CT CT ABD-PELV W/ CM
2 of 5 series · 16 of 46 positions shown, 18 images · IV contrast (APPLIED)
Comparison: None

CLINICAL DATA: RIGHT lower quadrant pain and tenderness for 10
days, history hypertension

EXAM:
CT ABDOMEN AND PELVIS WITH CONTRAST
TECHNIQUE: Multidetector CT imaging of the abdomen and pelvis was performed
using the standard protocol following bolus administration of
intravenous contrast. Sagittal and coronal MPR images reconstructed
from axial data set.
CONTRAST:  85mL OMNIPAQUE IOHEXOL 350 MG/ML SOLN IV. Dilute oral
contrast.

[Series 2: axial st · axial · 0.91mm/px · z∈[-464,-64]mm · 13 of 92 slices shown, 15 images]
[im 6/92  soft-tissue]
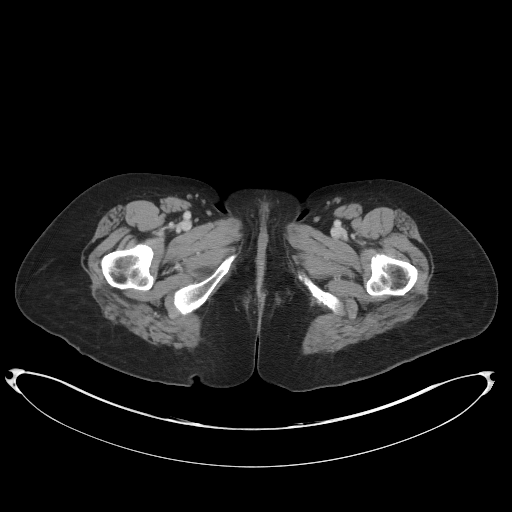
[im 6/92  bone]
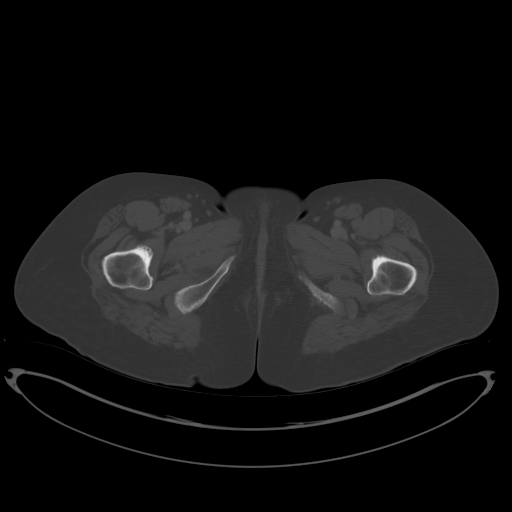
[im 11/92  soft-tissue]
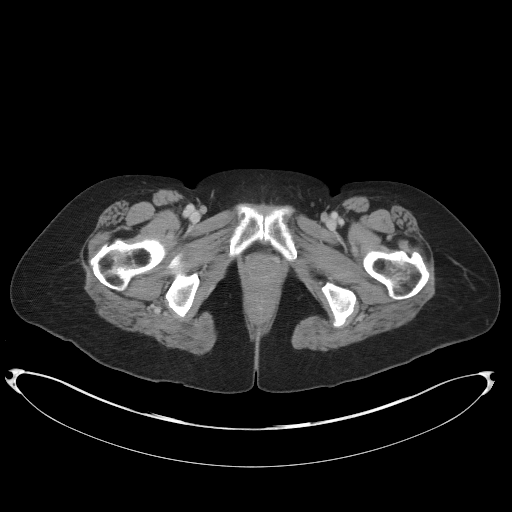
[im 21/92  soft-tissue]
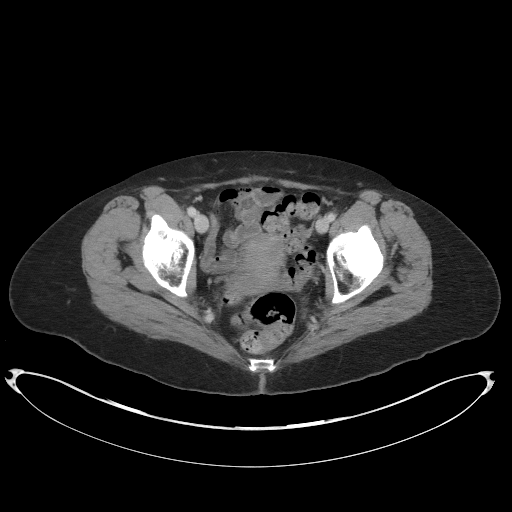
[im 26/92  soft-tissue]
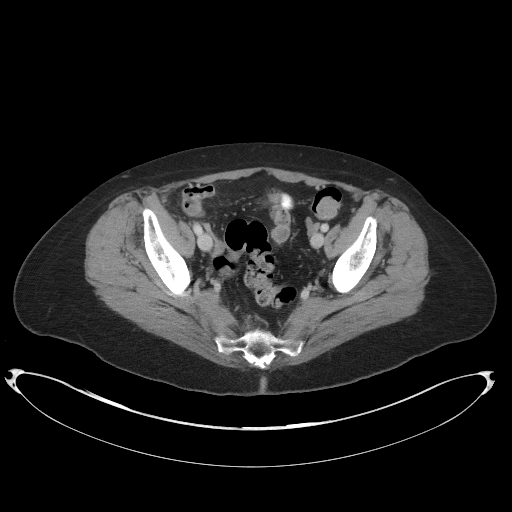
[im 31/92  soft-tissue]
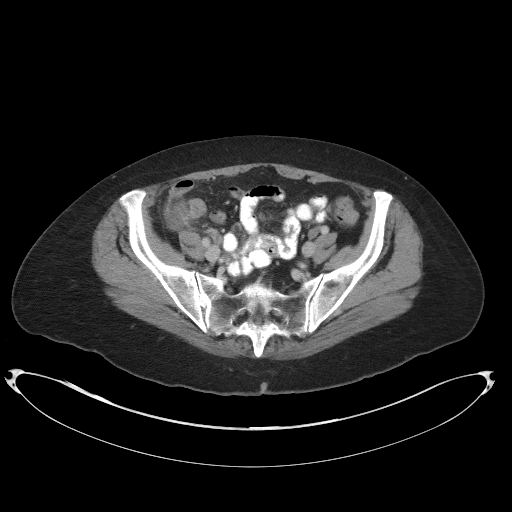
[im 41/92  soft-tissue]
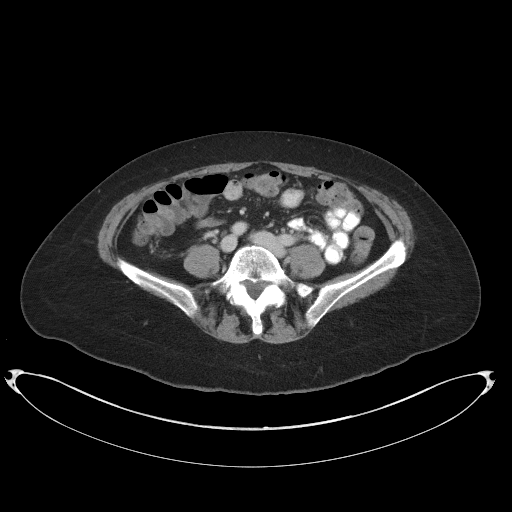
[im 46/92  soft-tissue]
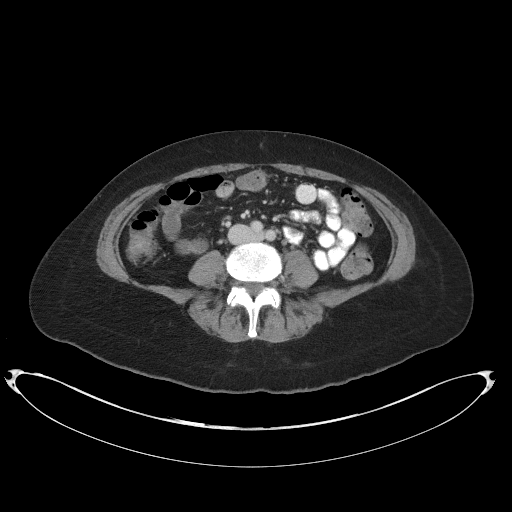
[im 51/92  soft-tissue]
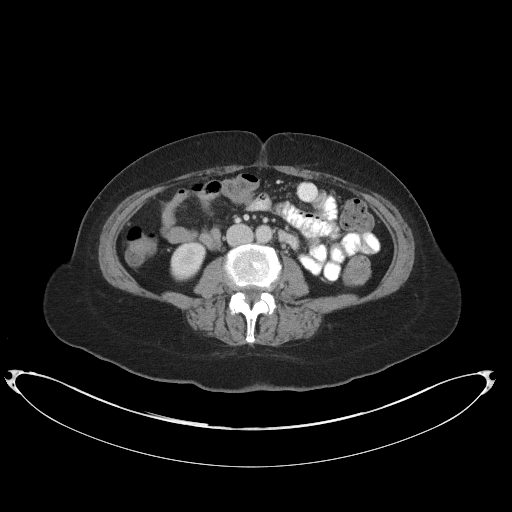
[im 61/92  soft-tissue]
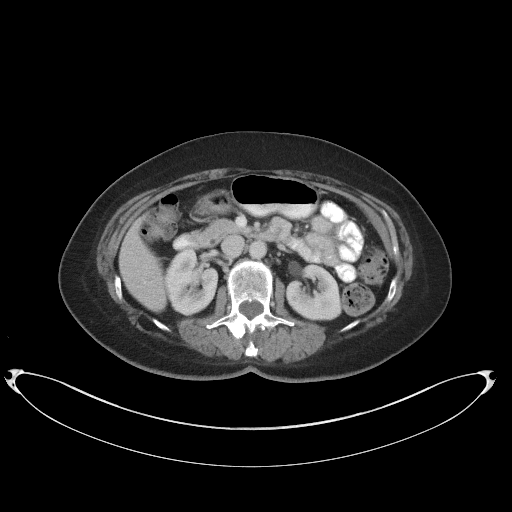
[im 61/92  bone]
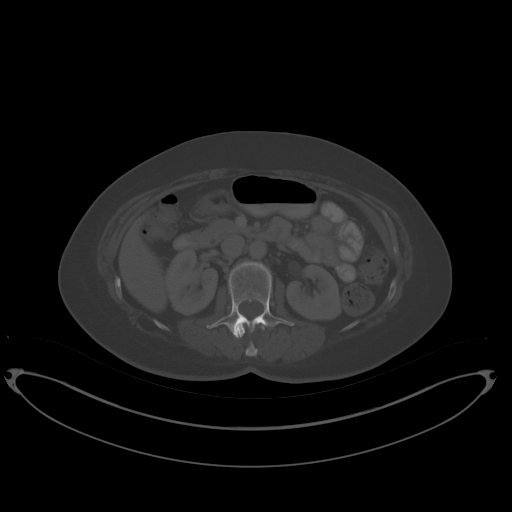
[im 66/92  soft-tissue]
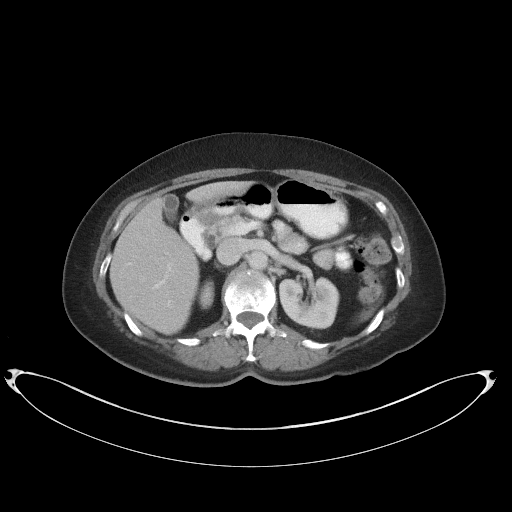
[im 71/92  soft-tissue]
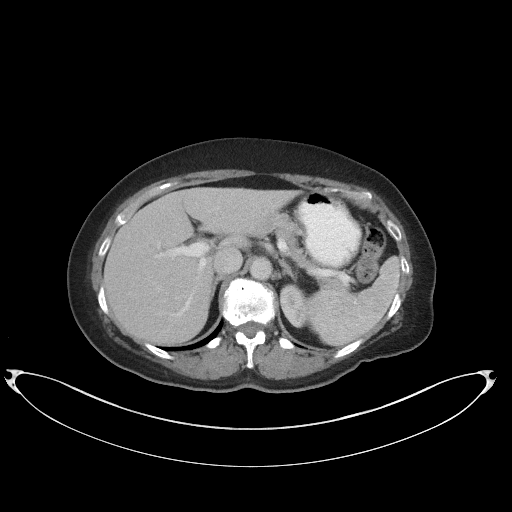
[im 81/92  soft-tissue]
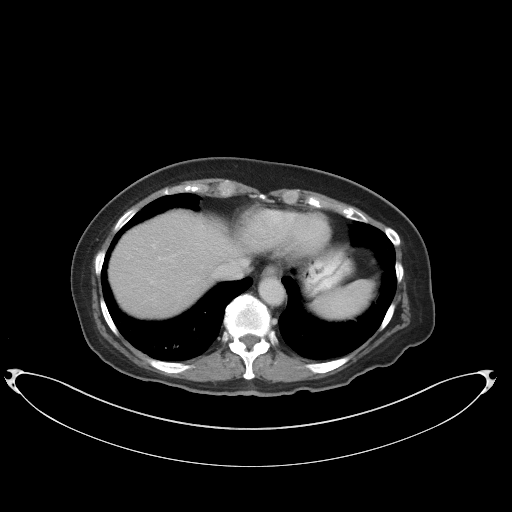
[im 86/92  soft-tissue]
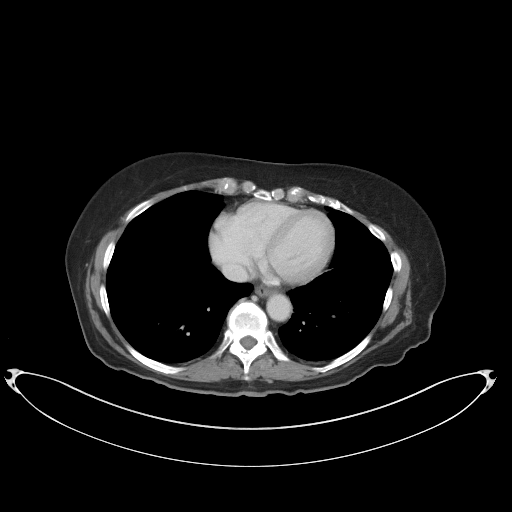

[Series 5: coronal st · coronal · 0.74mm/px · 3 of 79 slices shown]
[im 27/79  soft-tissue]
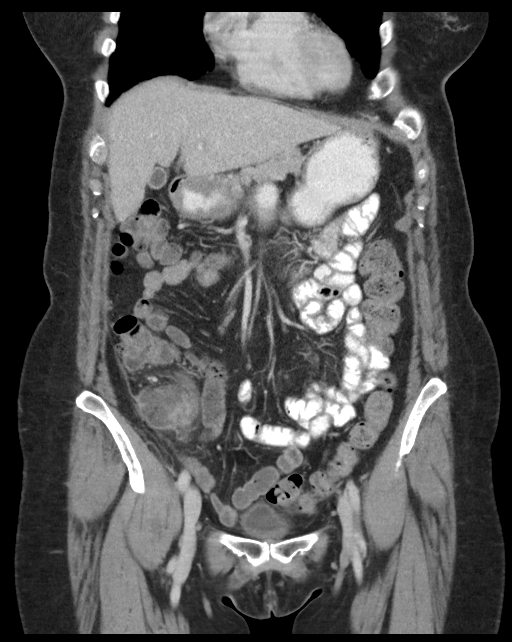
[im 35/79  soft-tissue]
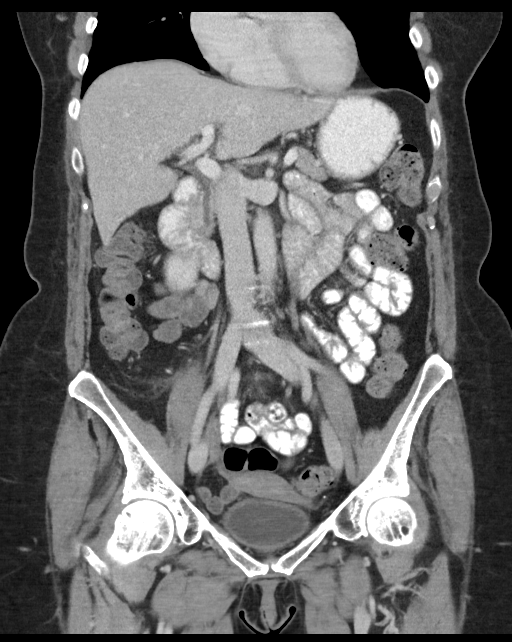
[im 44/79  soft-tissue]
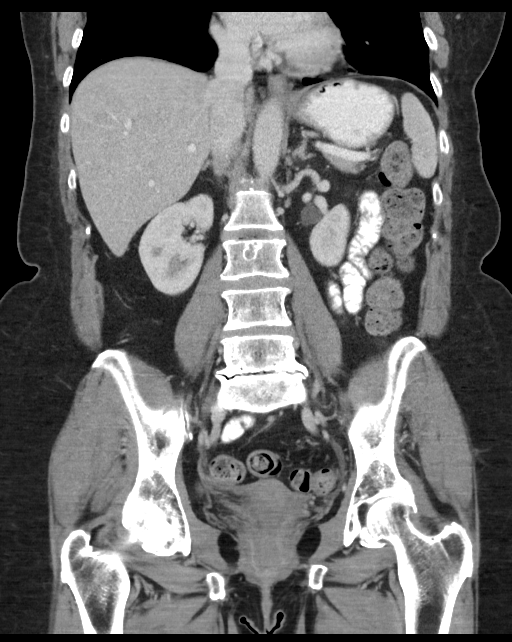

[16 of 46 positions shown; findings below may reference images not displayed]

FINDINGS: Minimal dependent atelectasis in both lower lobes.

Tiny nonspecific low to intermediate attenuation foci in both
kidneys, nonspecific.

Liver, spleen, pancreas, kidneys, and adrenal glands otherwise
normal appearance.

Contracted gallbladder.

Inflammatory process centered at the tip of the cecum where a
probable enhancing thickened appendix is identified extending into a
focal inflammatory collection measuring 4.4 x 2.7 x 3.2 cm in size
likely perforated appendicitis with surrounding abscess.

Mild thickening of the cecal tip and adjacent small bowel.

No free intraperitoneal air or fluid identified.

Bladder, ureters, uterus and ovaries unremarkable.

Stomach and remaining bowel loops normal appearance.

No mass, adenopathy, or acute bone lesion.

Degenerative disc disease changes of the thoracolumbar spine.
IMPRESSION: Inflammatory process at the tip of the cecum with identification of
a probable thickened enhancing appendix extending into an
inflammatory collection most consistent with perforated appendicitis
with a periappendiceal abscess of measuring 4.4 x 2.7 x 3.2 cm.

## 2018-03-01 ENCOUNTER — Ambulatory Visit
Admission: RE | Admit: 2018-03-01 | Discharge: 2018-03-01 | Disposition: A | Discharge status: Home / Self Care | Payer: Medicare Other | Source: Ambulatory Visit | Attending: Family Medicine | Admitting: Family Medicine

## 2018-03-01 ENCOUNTER — Other Ambulatory Visit: Payer: Self-pay | Admitting: Family Medicine

## 2018-03-01 DIAGNOSIS — R1031 Right lower quadrant pain: Secondary | ICD-10-CM

## 2018-03-01 MED ORDER — IOPAMIDOL (ISOVUE-300) INJECTION 61%
100.0000 mL | Freq: Once | INTRAVENOUS | Status: AC | PRN
  Administered 2018-03-01: 100 mL via INTRAVENOUS

## 2018-03-16 ENCOUNTER — Other Ambulatory Visit: Payer: Self-pay | Admitting: Family Medicine

## 2018-03-16 DIAGNOSIS — Z1231 Encounter for screening mammogram for malignant neoplasm of breast: Secondary | ICD-10-CM

## 2018-03-30 ENCOUNTER — Ambulatory Visit
Admission: RE | Admit: 2018-03-30 | Discharge: 2018-03-30 | Disposition: A | Discharge status: Home / Self Care | Payer: Medicare Other | Source: Ambulatory Visit | Attending: Family Medicine | Admitting: Family Medicine

## 2018-03-30 DIAGNOSIS — Z1231 Encounter for screening mammogram for malignant neoplasm of breast: Secondary | ICD-10-CM

## 2018-07-23 ENCOUNTER — Encounter (HOSPITAL_COMMUNITY): Payer: Self-pay

## 2018-07-23 ENCOUNTER — Other Ambulatory Visit: Payer: Self-pay

## 2018-07-23 ENCOUNTER — Ambulatory Visit (HOSPITAL_COMMUNITY): Payer: Medicare Other | Attending: Sports Medicine

## 2018-07-23 DIAGNOSIS — M6281 Muscle weakness (generalized): Secondary | ICD-10-CM

## 2018-07-23 DIAGNOSIS — M79604 Pain in right leg: Secondary | ICD-10-CM | POA: Insufficient documentation

## 2018-07-23 DIAGNOSIS — R29898 Other symptoms and signs involving the musculoskeletal system: Secondary | ICD-10-CM | POA: Diagnosis present

## 2018-07-23 NOTE — Therapy (Signed)
[facility] [address] [place], [place], [address] Phone: [phone]   Fax:  [phone]  Physical Therapy Evaluation  Patient Details  Name: Deborah Stevenson MRN: [id number] Date of Birth: 24-Sep-1945 Referring Provider (PT): Melina Fiddler, MD   Encounter Date: 07/23/2018  PT End of Session - 07/23/18 1613    Visit Number  1    Number of Visits  9    Date for PT Re-Evaluation  08/20/18    Authorization Type  [employer] Medicare    Authorization Time Period  07/23/18 to 08/20/18    Authorization - Visit Number  1    Authorization - Number of Visits  10    PT Start Time  1527   pt late   PT Stop Time  1607    PT Time Calculation (min)  40 min    Activity Tolerance  Patient tolerated treatment well    Behavior During Therapy  [facility] for tasks assessed/performed       Past Medical History:  Diagnosis Date  . Depression   . Hypertension     Past Surgical History:  Procedure Laterality Date  . CESAREAN SECTION      There were no vitals filed for this visit.   Subjective Assessment - 07/23/18 1530    Subjective  Pt reports that she saw an orthopedist becuase when she wakes up in the mornings, her foot is asleep but she doesn't have any numbness. She will also have pain with sitting with her foot propped up for a long time. She states they took x-rays which showed arthritis and DDD. She has b/b issues but her MDs are aware. She has soreness in her lower back but no pain. It's just the issue with the leg hurting but no numbness. She has no n/t. Her pain in the leg goes straight down the back of her leg. This started over a year ago but has progressively gotten worse. She states that Dr. Cleophas Dunker thinks it is due to the discs, nerves, and/or potentially with her R hip OA.    Limitations  Sitting    How long can you sit comfortably?  30 mins and then feels it once she starts to move after sitting    How long can you stand comfortably?  no  issues    How long can you walk comfortably?  no issues    Diagnostic tests  x-rays    Patient Stated Goals  make it more comfortable, see if this will help reduce her leg pain from happening every morning    Currently in Pain?  No/denies         [facility] PT Assessment - 07/23/18 0001      Assessment   Medical Diagnosis  lumbar spondylosis/DDD - R LSS, radiculopathy, R hip OA    Referring Provider (PT)  Melina Fiddler, MD    Onset Date/Surgical Date  --   over a year ago   Next MD Visit  07/31/18   but will reschedule this   Prior Therapy  yes for hand, none for current issue      Balance Screen   Has the patient fallen in the past 6 months  No    Has the patient had a decrease in activity level because of a fear of falling?   No    Is the patient reluctant to leave their home because of a fear of falling?   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[place], cooking      Observation/Other Assessments   Focus on Therapeutic Outcomes (FOTO)   to be completed next visit      ROM / Strength   AROM / PROM / Strength  AROM;Strength      AROM   AROM Assessment Site  Lumbar    Lumbar Flexion  68    Lumbar Extension  13    Lumbar - Right Side Bend  6    Lumbar - Left Side Bend  11    Lumbar - Right Rotation  50% limited    Lumbar - Left Rotation  50% limited      Strength   Strength Assessment Site  Hip;Knee;Ankle    Right Hip Flexion  4+/5    Right Hip Extension  4-/5    Right Hip External Rotation   4+/5    Right Hip Internal Rotation  5/5    Right Hip ABduction  4/5    Left Hip Flexion  5/5    Left Hip Extension  4/5    Left Hip External Rotation  5/5    Left Hip Internal Rotation  5/5    Left Hip ABduction  4-/5    Right Knee Flexion  4/5   recreated same pain   Right Knee Extension  5/5    Left Knee Flexion  4/5    Left Knee  Extension  5/5    Right Ankle Dorsiflexion  5/5    Left Ankle Dorsiflexion  5/5      Flexibility   Soft Tissue Assessment /Muscle Length  yes    Hamstrings  90/90 - R: 121deg (recreated pain); L: 132deg (felt stretch only)      Palpation   Spinal mobility  hypomobile, essentially non-painful and did not recreate same pain     Palpation comment  max restrictions bil glutes/piriformis which recreated her same pain with palpation -- with prolonged MFR, pt reported centralization of RLE pain      Special Tests    Special Tests  Lumbar    Lumbar Tests  Slump Test;Straight Leg Raise      Slump test   Findings  Negative    Comment  bil      Straight Leg Raise   Findings  Negative    Comment  bil      Balance   Balance Assessed  Yes      Static Standing Balance   Static Standing - Balance Support  No upper extremity supported    Static Standing Balance -  Activities   Single Leg Stance - Right Leg;Single Leg Stance - Left Leg    Static Standing - Comment/# of Minutes  R: 1:00 lateral trunk lean R;  L:30sec or <           Objective measurements completed on examination: See above findings.       PT Education - 07/23/18 1613    Education Details  exam findings, HEP, POC    Person(s) Educated  Patient    Methods  Explanation;Demonstration;Handout    Comprehension  Verbalized understanding;Returned demonstration       PT Short Term Goals - 07/23/18 1631      PT SHORT TERM GOAL #1   Title  Pt will report waking up with her RLE  pain 4 mornings out of the week to demo improved overall tissue extensibility and improved overall function.    Time  2    Period  Weeks    Status  New    Target Date  08/06/18      PT SHORT TERM GOAL #2   Title  Pt will be able to perform R SLS for 1:00 without R lateral trunk lean in order to demo improved functional hip and core strength.     Time  2    Period  Weeks    Status  New      PT SHORT TERM GOAL #3   Title  Pt will have  improved R HS length from 90/90 position to 130deg or > to demo reduced restrictions in the HS and reduced neural tension in sciatic nerve so as to reduce her R leg pain.         PT Long Term Goals - 07/23/18 1632      PT LONG TERM GOAL #1   Title  Pt will report waking up with her RLE pain 2 mornings out of the week to further demo improved overall function.    Time  4    Period  Weeks    Status  New    Target Date  08/20/18      PT LONG TERM GOAL #2   Title  Pt will have improved MMT by 1/2 grade throughout all mm in order to reduce her pain and promote return to PLOF.     Time  4    Status  New      PT LONG TERM GOAL #3   Title  Pt will report 3/10 RLE pain when she goes to stand after sitting for >1 hour in order to maximize her ability to drive and relax at home for longer periods of time without pan.     Time  4    Period  Weeks    Status  New             Plan - 07/23/18 1629    Clinical Impression Statement  Pt is pleasant73YO F who presents to OPPT with c/o R leg pain which started insidiously over 1 year ago. Pt denies LBP other than some soreness and her lumbar AROM WFL for pt. She does have h/o R hip pain and states that a R THA is not out of consideration in the future but for now it is not affecting her QOL enough to do it. Pt does present with deficits in MMT, especially proximal hip mm, HS flexibility, core strength, functional strength, and increased soft tissue restrictions throughout bil glutes/piriformis, with palpation to R recreating "similar pain" down RLE. Feel pt's c/c due to restrictions in hip mm which could be affecting the sciatic nerve, the mm themselves referring pain down her leg due to the restrictions and weakness in them, and/or R HS pathology, potentially all due to pt's h/o R hip pain and OA. Pt needs skilled PT intervention in order to reduce her pain and maximize return to PLOF.     Clinical Presentation  Stable    Clinical Presentation due  to:  see flowsheets for objective tests and measures    Clinical Decision Making  Low    Rehab Potential  Good    PT Frequency  2x / week    PT Duration  4 weeks    PT Treatment/Interventions  ADLs/Self Care Home Management;Aquatic Therapy;Cryotherapy;Electrical Stimulation;Moist Heat;Traction;Ultrasound;DME  Instruction;Gait training;Stair training;Functional mobility training;Therapeutic activities;Therapeutic exercise;Balance training;Neuromuscular re-education;Patient/family education;Orthotic Fit/Training;Manual techniques;Scar mobilization;Passive range of motion;Dry needling;Energy conservation;Taping    PT Next Visit Plan  review goals; adminsiter FOTO; sciatic nerve glides/HS stretching, glutes/piriformis stretching, BLE, functional, and core strengthening    PT Home Exercise Plan  eval: supine HS stretch hands behind thigh, supine figure 4    Consulted and Agree with Plan of Care  Patient       Patient will benefit from skilled therapeutic intervention in order to improve the following deficits and impairments:  Decreased range of motion, Decreased strength, Hypomobility, Increased fascial restricitons, Increased muscle spasms, Impaired flexibility, Pain  Visit Diagnosis: Pain in right leg - Plan: PT plan of care cert/re-cert  Muscle weakness (generalized) - Plan: PT plan of care cert/re-cert  Other symptoms and signs involving the musculoskeletal system - Plan: PT plan of care cert/re-cert     Problem List Patient Active Problem List   Diagnosis Date Noted  . Acute appendicitis with peritoneal abscess 08/24/2015  . Essential hypertension 08/24/2015        Jac Canavan PT, DPT  [facility] [address] [place], [place], [address] Phone: [phone]   Fax:  [phone]  Name: Deborah Stevenson MRN: [id number] Date of Birth: Mar 16, 1946

## 2018-07-26 ENCOUNTER — Ambulatory Visit (HOSPITAL_COMMUNITY): Payer: Medicare Other | Admitting: Physical Therapy

## 2018-07-26 DIAGNOSIS — R29898 Other symptoms and signs involving the musculoskeletal system: Secondary | ICD-10-CM

## 2018-07-26 DIAGNOSIS — M79604 Pain in right leg: Secondary | ICD-10-CM | POA: Diagnosis not present

## 2018-07-26 DIAGNOSIS — M6281 Muscle weakness (generalized): Secondary | ICD-10-CM

## 2018-07-26 NOTE — Patient Instructions (Addendum)
Hamstring Stretch: Active    Support behind right knee. Starting with knee bent, attempt to straighten knee until a comfortable stretch is felt in back of thigh. Hold ___30_ seconds. Repeat ___3_ times per set. Do __1__ sets per session. Do ___2_ sessions per day.  http://orth.exer.us/158   Copyright  VHI. All rights reserved.  Knee-to-Chest Stretch: Bilateral    With hands behind knees, pull both knees in to chest until a comfortable stretch is felt in lower back and buttocks. Keep back relaxed. Hold __30__ seconds. Repeat _3___ times per set. Do __1__ sets per session. Do __2__ sessions per day.  http://orth.exer.us/128   Copyright  VHI. All rights reserved.

## 2018-07-26 NOTE — Therapy (Signed)
[facility] [address] [place], [place], [address] Phone: [phone]   Fax:  [phone]  Physical Therapy Treatment  Patient Details  Name: Deborah Stevenson MRN: [id number] Date of Birth: Feb 17, 1946 Referring Provider (PT): Melina Fiddler, MD   Encounter Date: 07/26/2018  PT End of Session - 07/26/18 1605    Visit Number  2    Number of Visits  9    Date for PT Re-Evaluation  08/20/18    Authorization Type  [employer]HC Medicare    Authorization Time Period  07/23/18 to 08/20/18    Authorization - Visit Number  2    Authorization - Number of Visits  10    PT Start Time  1520    PT Stop Time  1600    PT Time Calculation (min)  40 min    Activity Tolerance  Patient tolerated treatment well    Behavior During Therapy  [facility] for tasks assessed/performed       Past Medical History:  Diagnosis Date  . Depression   . Hypertension     Past Surgical History:  Procedure Laterality Date  . CESAREAN SECTION      There were no vitals filed for this visit.  Subjective Assessment - 07/26/18 1520    Subjective  PT states no issues today.  She has done the home exercise program without difficulty     Limitations  Sitting    How long can you sit comfortably?  30 mins and then feels it once she starts to move after sitting    How long can you stand comfortably?  no issues    How long can you walk comfortably?  no issues    Diagnostic tests  x-rays    Patient Stated Goals  make it more comfortable, see if this will help reduce her leg pain from happening every morning    Currently in Pain?  No/denies           [facility] Adult PT Treatment/Exercise - 07/26/18 0001      Standardized Balance Assessment   Standardized Balance Assessment  --   foto 70     Exercises   Exercises  Lumbar      Lumbar Exercises: Stretches   Active Hamstring Stretch  3 reps;30 seconds    Double Knee to Chest Stretch  2 reps;30 seconds    Prone on Elbows Stretch   5 reps;10 seconds    Press Ups  5 reps    Piriformis Stretch  Right;Left;2 reps;60 seconds;Limitations    Piriformis Stretch Limitations  sitting     Other Lumbar Stretch Exercise  3 Dhip excursions x 3      Lumbar Exercises: Supine   Bridge  10 reps   10" hold      Manual Therapy   Manual Therapy  Joint mobilization;Soft tissue mobilization    Manual therapy comments  done seperate from all other aspects of treatment     Joint Mobilization  grade II L2-5 to improve mobility     Soft tissue mobilization  to paraspinal mm to decrease tightness              PT Education - 07/26/18 1605    Education Details  Goals and evaluation as well as new exercises     Person(s) Educated  Patient    Methods  Explanation;Handout    Comprehension  Verbalized understanding;Returned demonstration       PT Short Term Goals -  07/26/18 1550      PT SHORT TERM GOAL #1   Title  Pt will report waking up with her RLE pain 4 mornings out of the week to demo improved overall tissue extensibility and improved overall function.    Time  2    Period  Weeks    Status  On-going    Target Date  08/06/18      PT SHORT TERM GOAL #2   Title  Pt will be able to perform R SLS for 1:00 without R lateral trunk lean in order to demo improved functional hip and core strength.     Time  2    Period  Weeks    Status  On-going      PT SHORT TERM GOAL #3   Title  Pt will have improved R HS length from 90/90 position to 130deg or > to demo reduced restrictions in the HS and reduced neural tension in sciatic nerve so as to reduce her R leg pain.     Status  On-going        PT Long Term Goals - 07/26/18 1552      PT LONG TERM GOAL #1   Title  Pt will report waking up with her RLE pain 2 mornings out of the week to further demo improved overall function.    Time  4    Period  Weeks    Status  On-going      PT LONG TERM GOAL #2   Title  Pt will have improved MMT by 1/2 grade throughout all mm in order to  reduce her pain and promote return to PLOF.     Time  4    Status  On-going      PT LONG TERM GOAL #3   Title  Pt will report 3/10 RLE pain when she goes to stand after sitting for >1 hour in order to maximize her ability to drive and relax at home for longer periods of time without pan.     Time  4    Period  Weeks    Status  On-going            Plan - 07/26/18 1606    Clinical Impression Statement  Evaluation and goals reviewed with patient.  Foto completed with a score of 70,(pt 30% limited).  Therapist progressed stretches and sterengthening with good technique noted in all exercises. Therapist initiated manual with jt mobs to improve back ROM and pain.     Rehab Potential  Good    PT Frequency  2x / week    PT Duration  4 weeks    PT Treatment/Interventions  ADLs/Self Care Home Management;Aquatic Therapy;Cryotherapy;Electrical Stimulation;Moist Heat;Traction;Ultrasound;DME Instruction;Gait training;Stair training;Functional mobility training;Therapeutic activities;Therapeutic exercise;Balance training;Neuromuscular re-education;Patient/family education;Orthotic Fit/Training;Manual techniques;Scar mobilization;Passive range of motion;Dry needling;Energy conservation;Taping    PT Next Visit Plan  , BLE, functional, and core strengthening as well as continued manual and stretching .     PT Home Exercise Plan  eval: supine HS stretch hands behind thigh, supine figure 4; 3 D hip excursion     Consulted and Agree with Plan of Care  Patient       Patient will benefit from skilled therapeutic intervention in order to improve the following deficits and impairments:  Decreased range of motion, Decreased strength, Hypomobility, Increased fascial restricitons, Increased muscle spasms, Impaired flexibility, Pain  Visit Diagnosis: Pain in right leg  Muscle weakness (generalized)  Other symptoms and signs involving the  musculoskeletal system     Problem List Patient Active Problem  List   Diagnosis Date Noted  . Acute appendicitis with peritoneal abscess 08/24/2015  . Essential hypertension 08/24/2015   Virgina Organ, PT CLT [phone] 07/26/2018, 4:10 PM  [facility] [address] [place], [place], [address] Phone: [phone]   Fax:  [phone]  Name: Deborah Stevenson MRN: [id number] Date of Birth: 07-18-1945

## 2018-08-01 ENCOUNTER — Ambulatory Visit (HOSPITAL_COMMUNITY): Payer: Medicare Other

## 2018-08-01 ENCOUNTER — Encounter (HOSPITAL_COMMUNITY): Payer: Self-pay

## 2018-08-01 DIAGNOSIS — M6281 Muscle weakness (generalized): Secondary | ICD-10-CM

## 2018-08-01 DIAGNOSIS — M79604 Pain in right leg: Secondary | ICD-10-CM | POA: Diagnosis not present

## 2018-08-01 DIAGNOSIS — R29898 Other symptoms and signs involving the musculoskeletal system: Secondary | ICD-10-CM

## 2018-08-01 NOTE — Therapy (Signed)
[facility] [address] [place], [place], [address] Phone: [phone]   Fax:  [phone]  Physical Therapy Treatment  Patient Details  Name: Deborah Stevenson MRN: [id number] Date of Birth: Mar 13, 1946 Referring Provider (PT): Melina Fiddler, MD   Encounter Date: 08/01/2018  PT End of Session - 08/01/18 1518    Visit Number  3    Number of Visits  9    Date for PT Re-Evaluation  08/20/18    Authorization Type  [employer]HC Medicare    Authorization Time Period  07/23/18 to 08/20/18    Authorization - Visit Number  3    Authorization - Number of Visits  10    PT Start Time  1518    PT Stop Time  1601    PT Time Calculation (min)  43 min    Activity Tolerance  Patient tolerated treatment well    Behavior During Therapy  [facility] for tasks assessed/performed       Past Medical History:  Diagnosis Date  . Depression   . Hypertension     Past Surgical History:  Procedure Laterality Date  . CESAREAN SECTION      There were no vitals filed for this visit.  Subjective Assessment - 08/01/18 1519    Subjective  Pt states that she is doing well. She states that she has even had a couple of nights without the issue.    Limitations  Sitting    How long can you sit comfortably?  30 mins and then feels it once she starts to move after sitting    How long can you stand comfortably?  no issues    How long can you walk comfortably?  no issues    Diagnostic tests  x-rays    Patient Stated Goals  make it more comfortable, see if this will help reduce her leg pain from happening every morning    Currently in Pain?  No/denies             [facility] Adult PT Treatment/Exercise - 08/01/18 0001      Lumbar Exercises: Stretches   Piriformis Stretch  Right;Left;3 reps;30 seconds    Piriformis Stretch Limitations  sitting, figure 4, pulling knee towards opposit shoulder    Gastroc Stretch  3 reps;30 seconds    Gastroc Stretch Limitations  slant board     Other Lumbar Stretch Exercise  fwd child's pose 3x30" (palms up)    Other Lumbar Stretch Exercise  fwd/R/L lumbar flex stretch with large physioball 5x10" holds each      Lumbar Exercises: Machines for Strengthening   Cybex Knee Flexion  3plates, BLE, x10 reps; isometrics 5x10" holds BLE    Leg Press  multigym, 40#, BLE, 2x10 reps      Lumbar Exercises: Supine   Bridge  10 reps    Bridge Limitations  10" holds, bil ankle DF     Other Supine Lumbar Exercises  bridge +HS curl on medium physioball x10 reps      Lumbar Exercises: Sidelying   Clam  Both;15 reps    Clam Limitations  GTB      Manual Therapy   Manual Therapy  Soft tissue mobilization    Manual therapy comments  done seperate from all other aspects of treatment     Soft tissue mobilization  insturment assisted STM with green weighted ball to R proximal gastroc, lateral HS, glutes, piriformis, and R lumbar paraspinals to reduce pain and restrictions  PT Education - 08/01/18 1519    Education Details  exercise technique, continue HEP    Person(s) Educated  Patient    Methods  Explanation;Demonstration    Comprehension  Verbalized understanding;Returned demonstration       PT Short Term Goals - 07/26/18 1550      PT SHORT TERM GOAL #1   Title  Pt will report waking up with her RLE pain 4 mornings out of the week to demo improved overall tissue extensibility and improved overall function.    Time  2    Period  Weeks    Status  On-going    Target Date  08/06/18      PT SHORT TERM GOAL #2   Title  Pt will be able to perform R SLS for 1:00 without R lateral trunk lean in order to demo improved functional hip and core strength.     Time  2    Period  Weeks    Status  On-going      PT SHORT TERM GOAL #3   Title  Pt will have improved R HS length from 90/90 position to 130deg or > to demo reduced restrictions in the HS and reduced neural tension in sciatic nerve so as to reduce her R leg pain.      Status  On-going        PT Long Term Goals - 07/26/18 1552      PT LONG TERM GOAL #1   Title  Pt will report waking up with her RLE pain 2 mornings out of the week to further demo improved overall function.    Time  4    Period  Weeks    Status  On-going      PT LONG TERM GOAL #2   Title  Pt will have improved MMT by 1/2 grade throughout all mm in order to reduce her pain and promote return to PLOF.     Time  4    Status  On-going      PT LONG TERM GOAL #3   Title  Pt will report 3/10 RLE pain when she goes to stand after sitting for >1 hour in order to maximize her ability to drive and relax at home for longer periods of time without pan.     Time  4    Period  Weeks    Status  On-going            Plan - 08/01/18 1607    Clinical Impression Statement  Pt presenting to therapy stating that she overall is starting to feel better and has noticed a difference already. Continued with established POC focusing on improving general flexibility and overall strength. Began isolated HS strengthening this date to address pain in this mm with leg press, cybex knee flexion, and knee flexion isometric holds on cybex machine. Educated pt that she would likely be sore following today's session and she verbalized understanding. Ended with manual for soft tissue restrictions in R gastroc, HS, glutes, piriformis, and lumbar paraspinals in order to reduce pain, improve flexibility, and promote improved neural mobility. No pain at EOS. Continue as planned, progressing as able.    Rehab Potential  Good    PT Frequency  2x / week    PT Duration  4 weeks    PT Treatment/Interventions  ADLs/Self Care Home Management;Aquatic Therapy;Cryotherapy;Electrical Stimulation;Moist Heat;Traction;Ultrasound;DME Instruction;Gait training;Stair training;Functional mobility training;Therapeutic activities;Therapeutic exercise;Balance training;Neuromuscular re-education;Patient/family education;Orthotic  Fit/Training;Manual techniques;Scar mobilization;Passive range of motion;Dry needling;Energy  conservation;Taping    PT Next Visit Plan  continue HS, posterior chain, BLE, functional, and core strengthening; manual for reduced soft tissue restrictions and stretching    PT Home Exercise Plan  eval: supine HS stretch hands behind thigh, supine figure 4; 3 D hip excursion     Consulted and Agree with Plan of Care  Patient       Patient will benefit from skilled therapeutic intervention in order to improve the following deficits and impairments:  Decreased range of motion, Decreased strength, Hypomobility, Increased fascial restricitons, Increased muscle spasms, Impaired flexibility, Pain  Visit Diagnosis: Pain in right leg  Muscle weakness (generalized)  Other symptoms and signs involving the musculoskeletal system     Problem List Patient Active Problem List   Diagnosis Date Noted  . Acute appendicitis with peritoneal abscess 08/24/2015  . Essential hypertension 08/24/2015       Jac Canavan PT, DPT  [facility] [address] [place], [place], [address] Phone: [phone]   Fax:  [phone]  Name: Deborah Stevenson MRN: [id number] Date of Birth: 19-Feb-1946

## 2018-08-03 ENCOUNTER — Encounter (HOSPITAL_COMMUNITY): Payer: Self-pay

## 2018-08-03 ENCOUNTER — Ambulatory Visit (HOSPITAL_COMMUNITY): Payer: Medicare Other

## 2018-08-03 DIAGNOSIS — M6281 Muscle weakness (generalized): Secondary | ICD-10-CM

## 2018-08-03 DIAGNOSIS — R29898 Other symptoms and signs involving the musculoskeletal system: Secondary | ICD-10-CM

## 2018-08-03 DIAGNOSIS — M79604 Pain in right leg: Secondary | ICD-10-CM | POA: Diagnosis not present

## 2018-08-03 NOTE — Therapy (Signed)
[facility] [address] [place], [place], [address] Phone: [phone]   Fax:  [phone]  Physical Therapy Treatment  Patient Details  Name: Deborah Stevenson MRN: [id number] Date of Birth: 07/04/1945 Referring Provider (PT): Melina Fiddlerebecca S Bassett, MD   Encounter Date: 08/03/2018  PT End of Session - 08/03/18 1611    Visit Number  4    Number of Visits  9    Date for PT Re-Evaluation  08/20/18    Authorization Type  [employer] Medicare    Authorization Time Period  07/23/18 to 08/20/18    Authorization - Visit Number  4    Authorization - Number of Visits  10    PT Start Time  1608    PT Stop Time  1652    PT Time Calculation (min)  44 min    Activity Tolerance  Patient tolerated treatment well;No increased pain    Behavior During Therapy  WFL for tasks assessed/performed       Past Medical History:  Diagnosis Date  . Depression   . Hypertension     Past Surgical History:  Procedure Laterality Date  . CESAREAN SECTION      There were no vitals filed for this visit.  Subjective Assessment - 08/03/18 1611    Subjective  Pt reports she is progressing well with therapy, no reoprts of pain today and minimal soreness following last session.  Pt reports she has woken 2x this week due to pain.      How long can you sit comfortably?  30 mins and then feels it once she starts to move after sitting    Patient Stated Goals  make it more comfortable, see if this will help reduce her leg pain from happening every morning    Currently in Pain?  No/denies                       [facility] - 08/03/18 0001      Lumbar Exercises: Stretches   Active Hamstring Stretch  2 reps;30 seconds    Active Hamstring Stretch Limitations  supine    Figure 4 Stretch  1 rep;30 seconds    Figure 4 Stretch Limitations  supine figure 4    Other Lumbar Stretch Exercise  fwd child's pose 3x30" (palms up) pillow between knees for  comfort    Other Lumbar Stretch Exercise  fwd/R/L lumbar flex stretch with large physioball 5x10" holds each      Lumbar Exercises: Machines for Strengthening   Cybex Knee Flexion  3plates, BLE, 2x10 reps; isometrics 5x10" holds BLE    Leg Press  multigym, 40#, BLE, 2x10 reps      Lumbar Exercises: Standing   Other Standing Lumbar Exercises  SLS 60" BLE    Other Standing Lumbar Exercises  tandem stance on foam +pallof 10x 4       Lumbar Exercises: Supine   Other Supine Lumbar Exercises  bridge +HS curl on medium physioball x10 reps 2 sets      Manual Therapy   Manual Therapy  Soft tissue mobilization    Manual therapy comments  done seperate from all other aspects of treatment     Soft tissue mobilization  STM to Rt gluteal/piriformis, hamstring, gastroc               PT Short Term Goals - 07/26/18 1550      PT SHORT TERM GOAL #1  Title  Pt will report waking up with her RLE pain 4 mornings out of the week to demo improved overall tissue extensibility and improved overall function.    Time  2    Period  Weeks    Status  On-going    Target Date  08/06/18      PT SHORT TERM GOAL #2   Title  Pt will be able to perform R SLS for 1:00 without R lateral trunk lean in order to demo improved functional hip and core strength.     Time  2    Period  Weeks    Status  On-going      PT SHORT TERM GOAL #3   Title  Pt will have improved R HS length from 90/90 position to 130deg or > to demo reduced restrictions in the HS and reduced neural tension in sciatic nerve so as to reduce her R leg pain.     Status  On-going        PT Long Term Goals - 07/26/18 1552      PT LONG TERM GOAL #1   Title  Pt will report waking up with her RLE pain 2 mornings out of the week to further demo improved overall function.    Time  4    Period  Weeks    Status  On-going      PT LONG TERM GOAL #2   Title  Pt will have improved MMT by 1/2 grade throughout all mm in order to reduce her pain and  promote return to PLOF.     Time  4    Status  On-going      PT LONG TERM GOAL #3   Title  Pt will report 3/10 RLE pain when she goes to stand after sitting for >1 hour in order to maximize her ability to drive and relax at home for longer periods of time without pan.     Time  4    Period  Weeks    Status  On-going            Plan - 08/03/18 1657    Clinical Impression Statement  Continued with established POC for LE strengthening and stretches for mobility.  Added squats for gluteal strengthening and pallof for balance and core strengthening.  Pt able to stand 60" SLS BLE with no trunk rotation noted.  EOS with manual to address soft tissue restrictions in Rt gluts/piriformis, hamstrings and gastroc with minimal soft tissue restrctions noted wiht Rt piriformis only, pt given print out of figure 4 supine piriformis following reports of relief with stretch.      Rehab Potential  Good    PT Frequency  2x / week    PT Duration  4 weeks    PT Treatment/Interventions  ADLs/Self Care Home Management;Aquatic Therapy;Cryotherapy;Electrical Stimulation;Moist Heat;Traction;Ultrasound;DME Instruction;Gait training;Stair training;Functional mobility training;Therapeutic activities;Therapeutic exercise;Balance training;Neuromuscular re-education;Patient/family education;Orthotic Fit/Training;Manual techniques;Scar mobilization;Passive range of motion;Dry needling;Energy conservation;Taping    PT Next Visit Plan  continue HS, posterior chain, BLE, functional, and core strengthening; manual for reduced soft tissue restrictions PRN and stretching    PT Home Exercise Plan  eval: supine HS stretch hands behind thigh, supine figure 4; 3 D hip excursion        Patient will benefit from skilled therapeutic intervention in order to improve the following deficits and impairments:  Decreased range of motion, Decreased strength, Hypomobility, Increased fascial restricitons, Increased muscle spasms, Impaired  flexibility, Pain  Visit Diagnosis:  Pain in right leg  Muscle weakness (generalized)  Other symptoms and signs involving the musculoskeletal system     Problem List Patient Active Problem List   Diagnosis Date Noted  . Acute appendicitis with peritoneal abscess 08/24/2015  . Essential hypertension 08/24/2015   Becky Sax, LPTA; CBIS [phone]  Juel Burrow 08/03/2018, 5:08 PM  [facility] [address] [place], [place], [address] Phone: [phone]   Fax:  [phone]  Name: Deborah Stevenson MRN: [id number] Date of Birth: 1946-04-07

## 2018-08-08 ENCOUNTER — Encounter (HOSPITAL_COMMUNITY): Payer: Self-pay

## 2018-08-08 ENCOUNTER — Other Ambulatory Visit: Payer: Self-pay

## 2018-08-08 ENCOUNTER — Ambulatory Visit (HOSPITAL_COMMUNITY): Payer: Medicare Other

## 2018-08-08 DIAGNOSIS — M79604 Pain in right leg: Secondary | ICD-10-CM | POA: Diagnosis not present

## 2018-08-08 DIAGNOSIS — M6281 Muscle weakness (generalized): Secondary | ICD-10-CM

## 2018-08-08 DIAGNOSIS — R29898 Other symptoms and signs involving the musculoskeletal system: Secondary | ICD-10-CM

## 2018-08-08 NOTE — Therapy (Signed)
[facility] [address] [place], [place], [address] Phone: [phone]   Fax:  [phone]  Physical Therapy Treatment  Patient Details  Name: Deborah Stevenson MRN: [id number] Date of Birth: 09/09/1945 Referring Provider (PT): Melina Fiddler, MD   Encounter Date: 08/08/2018  PT End of Session - 08/08/18 1535    Visit Number  5    Number of Visits  9    Date for PT Re-Evaluation  08/20/18    Authorization Type  [employer] Medicare    Authorization Time Period  07/23/18 to 08/20/18    Authorization - Visit Number  5    Authorization - Number of Visits  10    PT Start Time  1521    PT Stop Time  1602    PT Time Calculation (min)  41 min    Activity Tolerance  Patient tolerated treatment well;No increased pain    Behavior During Therapy  WFL for tasks assessed/performed       Past Medical History:  Diagnosis Date  . Depression   . Hypertension     Past Surgical History:  Procedure Laterality Date  . CESAREAN SECTION      There were no vitals filed for this visit.  Subjective Assessment - 08/08/18 1526    Subjective  Patient reports she feels good today and denies pain. She states she slept well last night and did not have any pain. She reports her stretches are going well at home.     Limitations  Sitting    How long can you sit comfortably?  30 mins and then feels it once she starts to move after sitting    Patient Stated Goals  make it more comfortable, see if this will help reduce her leg pain from happening every morning    Currently in Pain?  No/denies         [facility] Adult PT Treatment/Exercise - 08/08/18 0001      Lumbar Exercises: Stretches   Figure 4 Stretch  30 seconds;Supine;With overpressure;3 reps    Figure 4 Stretch Limitations  supine figure 4    Other Lumbar Stretch Exercise  Child's pose: 10x 10 seconds, pillow between knees for comfort      Lumbar Exercises: Standing   Other Standing Lumbar Exercises  tandem  stance on foam +pallof 10x 4    red TB     Lumbar Exercises: Supine   Bridge  15 reps;3 seconds    Single Leg Bridge  10 reps;3 seconds;Limitations   bil LE     Lumbar Exercises: Sidelying   Clam  Both;15 reps;3 seconds    Clam Limitations  GTB    Other Sidelying Lumbar Exercises  Reverse clamshell: 1x 15 reps bil LE         Balance Exercises: Standing - 08/08/18 1550  Tandem Stance Eyes open;Other reps (comment);Limitations (2x 10 reps overhead flexion wiht 2# bar, alt feet alignment)  SLS with Vectors Solid surface;5 reps;10 secs (3 way vector, no UE)     PT Education - 08/08/18 1536    Education Details  Educated on exercises throughout session and updated HEP.     Person(s) Educated  Patient    Methods  Explanation;Handout    Comprehension  Verbalized understanding;Returned demonstration       PT Short Term Goals - 07/26/18 1550      PT SHORT TERM GOAL #1   Title  Pt will report waking up with her RLE pain  4 mornings out of the week to demo improved overall tissue extensibility and improved overall function.    Time  2    Period  Weeks    Status  On-going    Target Date  08/06/18      PT SHORT TERM GOAL #2   Title  Pt will be able to perform R SLS for 1:00 without R lateral trunk lean in order to demo improved functional hip and core strength.     Time  2    Period  Weeks    Status  On-going      PT SHORT TERM GOAL #3   Title  Pt will have improved R HS length from 90/90 position to 130deg or > to demo reduced restrictions in the HS and reduced neural tension in sciatic nerve so as to reduce her R leg pain.     Status  On-going        PT Long Term Goals - 07/26/18 1552      PT LONG TERM GOAL #1   Title  Pt will report waking up with her RLE pain 2 mornings out of the week to further demo improved overall function.    Time  4    Period  Weeks    Status  On-going      PT LONG TERM GOAL #2   Title  Pt will have improved MMT by 1/2 grade throughout all  mm in order to reduce her pain and promote return to PLOF.     Time  4    Status  On-going      PT LONG TERM GOAL #3   Title  Pt will report 3/10 RLE pain when she goes to stand after sitting for >1 hour in order to maximize her ability to drive and relax at home for longer periods of time without pan.     Time  4    Period  Weeks    Status  On-going         Plan - 08/08/18 1535    Clinical Impression Statement  Therapy session continued with current POC targeting LE strengthening, core stabilization, and hip mobility with stretching. Patient continued with bridges and progressed to single leg bridge today with no difficulty, bridges with a hamstring curl remain too challenging for patient at this time. Patient was able to apply overpressure to figure four stretch with no discomfort and reported stronger stretch sensation. She continued with balance challenges in tandem today. At EOS patient reported feeling lightheaded and was provided a seated rest break. Her BP was 108/62 with HR of 68 bpm. After 1 minute of resting she reported the episode had resolved. Water was provided and patient  Given updated HEP prior to leaving therapy. She will continue to benefit from skilled PT interventions to address impairments and progress towards goals.    Rehab Potential  Good    PT Frequency  2x / week    PT Duration  4 weeks    PT Treatment/Interventions  ADLs/Self Care Home Management;Aquatic Therapy;Cryotherapy;Electrical Stimulation;Moist Heat;Traction;Ultrasound;DME Instruction;Gait training;Stair training;Functional mobility training;Therapeutic activities;Therapeutic exercise;Balance training;Neuromuscular re-education;Patient/family education;Orthotic Fit/Training;Manual techniques;Scar mobilization;Passive range of motion;Dry needling;Energy conservation;Taping    PT Next Visit Plan  Continue exercises to target HS, posterior chain, Bil LE, functional, and core strengthening. Perform manual for  reduced soft tissue restrictions PRN and stretching.    PT Home Exercise Plan  eval: supine HS stretch hands behind thigh, supine figure 4; 3 D hip excursion ;  08/08/18 - clamshell with green TB    Consulted and Agree with Plan of Care  Patient       Patient will benefit from skilled therapeutic intervention in order to improve the following deficits and impairments:  Decreased range of motion, Decreased strength, Hypomobility, Increased fascial restricitons, Increased muscle spasms, Impaired flexibility, Pain  Visit Diagnosis: Pain in right leg  Muscle weakness (generalized)  Other symptoms and signs involving the musculoskeletal system     Problem List Patient Active Problem List   Diagnosis Date Noted  . Acute appendicitis with peritoneal abscess 08/24/2015  . Essential hypertension 08/24/2015    Valentino Saxon, PT, DPT, [facility] Physical Therapist with [facility]  08/08/2018 4:09 PM    [facility] [address] [place], [place], [address] Phone: [phone]   Fax:  [phone]  Name: Tanetta Fuhriman MRN: [id number] Date of Birth: 03/18/1946

## 2018-08-10 ENCOUNTER — Ambulatory Visit (HOSPITAL_COMMUNITY): Payer: Medicare Other | Admitting: Physical Therapy

## 2018-08-14 ENCOUNTER — Ambulatory Visit (HOSPITAL_COMMUNITY): Payer: Medicare Other | Attending: Sports Medicine | Admitting: Physical Therapy

## 2018-08-14 DIAGNOSIS — M6281 Muscle weakness (generalized): Secondary | ICD-10-CM

## 2018-08-14 DIAGNOSIS — R29898 Other symptoms and signs involving the musculoskeletal system: Secondary | ICD-10-CM | POA: Insufficient documentation

## 2018-08-14 DIAGNOSIS — M79604 Pain in right leg: Secondary | ICD-10-CM | POA: Diagnosis not present

## 2018-08-14 NOTE — Therapy (Signed)
[facility] [address] [place], [place], [address] Phone: [phone]   Fax:  [phone]  Physical Therapy Treatment  Patient Details  Name: Deborah Stevenson MRN: [id number] Date of Birth: [birth year] Referring Provider (PT): Melina Fiddler, MD   Encounter Date: 08/14/2018  PT End of Session - 08/14/18 1745    Visit Number  6    Number of Visits  9    Date for PT Re-Evaluation  08/20/18    Authorization Type  [employer] Medicare    Authorization Time Period  07/23/18 to 08/20/18    Authorization - Visit Number  6    Authorization - Number of Visits  10    PT Start Time  1435    PT Stop Time  1520    PT Time Calculation (min)  45 min    Activity Tolerance  Patient tolerated treatment well;No increased pain    Behavior During Therapy  WFL for tasks assessed/performed       Past Medical History:  Diagnosis Date  . Depression   . Hypertension     Past Surgical History:  Procedure Laterality Date  . CESAREAN SECTION      There were no vitals filed for this visit.  Subjective Assessment - 08/14/18 1743    Subjective  pt without any pain today, overall feeling good.     Currently in Pain?  No/denies                       [facility] Adult PT Treatment/Exercise - 08/14/18 0001      Lumbar Exercises: Stretches   Piriformis Stretch  Left;Right;1 rep;30 seconds    Other Lumbar Stretch Exercise  shown mid back stretch in seated for alternative way ot stretching.      Lumbar Exercises: Standing   Forward Lunge  15 reps;Limitations    Forward Lunge Limitations  4" step no UE's    Other Standing Lumbar Exercises  tandem stance on foam +pallof 10x 4  GTB      Lumbar Exercises: Supine   Bridge  15 reps;3 seconds    Single Leg Bridge  10 reps;3 seconds;Limitations      Lumbar Exercises: Sidelying   Clam  Both;15 reps;3 seconds    Clam Limitations  GTB    Hip Abduction  1 second               PT Short Term  Goals - 07/26/18 1550      PT SHORT TERM GOAL #1   Title  Pt will report waking up with her RLE pain 4 mornings out of the week to demo improved overall tissue extensibility and improved overall function.    Time  2    Period  Weeks    Status  On-going    Target Date  08/06/18      PT SHORT TERM GOAL #2   Title  Pt will be able to perform R SLS for 1:00 without R lateral trunk lean in order to demo improved functional hip and core strength.     Time  2    Period  Weeks    Status  On-going      PT SHORT TERM GOAL #3   Title  Pt will have improved R HS length from 90/90 position to 130deg or > to demo reduced restrictions in the HS and reduced neural tension in sciatic nerve so as to reduce her R leg pain.  Status  On-going        PT Long Term Goals - 07/26/18 1552      PT LONG TERM GOAL #1   Title  Pt will report waking up with her RLE pain 2 mornings out of the week to further demo improved overall function.    Time  4    Period  Weeks    Status  On-going      PT LONG TERM GOAL #2   Title  Pt will have improved MMT by 1/2 grade throughout all mm in order to reduce her pain and promote return to PLOF.     Time  4    Status  On-going      PT LONG TERM GOAL #3   Title  Pt will report 3/10 RLE pain when she goes to stand after sitting for >1 hour in order to maximize her ability to drive and relax at home for longer periods of time without pan.     Time  4    Period  Weeks    Status  On-going            Plan - 08/14/18 1743    Clinical Impression Statement  cotninued with LE and core strengthening, stretching.  Instructed with seated mid back stretch today if unable to assume child pose and seated piriformis stretch.  Pt without any lightheaded episodes or complaints during session today.  Able to complete all exercises with cues but no issues.      Rehab Potential  Good    PT Frequency  2x / week    PT Duration  4 weeks    PT Treatment/Interventions  ADLs/Self  Care Home Management;Aquatic Therapy;Cryotherapy;Electrical Stimulation;Moist Heat;Traction;Ultrasound;DME Instruction;Gait training;Stair training;Functional mobility training;Therapeutic activities;Therapeutic exercise;Balance training;Neuromuscular re-education;Patient/family education;Orthotic Fit/Training;Manual techniques;Scar mobilization;Passive range of motion;Dry needling;Energy conservation;Taping    PT Next Visit Plan  Continue exercises to target HS, posterior chain, Bil LE, functional, and core strengthening. Perform manual for reduced soft tissue restrictions PRN and stretching.    PT Home Exercise Plan  eval: supine HS stretch hands behind thigh, supine figure 4; 3 D hip excursion ; 08/08/18 - clamshell with green TB    Consulted and Agree with Plan of Care  Patient       Patient will benefit from skilled therapeutic intervention in order to improve the following deficits and impairments:  Decreased range of motion, Decreased strength, Hypomobility, Increased fascial restricitons, Increased muscle spasms, Impaired flexibility, Pain  Visit Diagnosis: Pain in right leg  Muscle weakness (generalized)  Other symptoms and signs involving the musculoskeletal system     Problem List Patient Active Problem List   Diagnosis Date Noted  . Acute appendicitis with peritoneal abscess 08/24/2015  . Essential hypertension 08/24/2015   Lurena Nida, PTA/CLT [phone]  Lurena Nida 08/14/2018, 5:46 PM  [facility] [address] [place], [place], [address] Phone: [phone]   Fax:  [phone]  Name: Deborah Stevenson MRN: [id number] Date of Birth: [birth year]

## 2018-08-17 ENCOUNTER — Encounter (HOSPITAL_COMMUNITY): Payer: Self-pay

## 2018-08-17 ENCOUNTER — Ambulatory Visit (HOSPITAL_COMMUNITY): Payer: Medicare Other

## 2018-08-17 DIAGNOSIS — M79604 Pain in right leg: Secondary | ICD-10-CM

## 2018-08-17 DIAGNOSIS — R29898 Other symptoms and signs involving the musculoskeletal system: Secondary | ICD-10-CM

## 2018-08-17 DIAGNOSIS — M6281 Muscle weakness (generalized): Secondary | ICD-10-CM

## 2018-08-17 NOTE — Therapy (Signed)
[facility] [address] [place], [place], [address] Phone: [phone]   Fax:  [phone]   PHYSICAL THERAPY DISCHARGE SUMMARY  Visits from Start of Care: 7  Current functional level related to goals / functional outcomes: See below   Remaining deficits: See below   Education / Equipment: D/c HEP  Plan: Patient agrees to discharge.  Patient goals were met. Patient is being discharged due to meeting the stated rehab goals.  ?????    Physical Therapy Treatment  Patient Details  Name: Deborah Stevenson MRN: [id number] Date of Birth: 1945/11/04 Referring Provider (PT): Verner Chol, MD   Encounter Date: 08/17/2018  PT End of Session - 08/17/18 1432    Visit Number  7    Number of Visits  8   corrected visit count this date   Date for PT Re-Evaluation  08/20/18    Authorization Type  [employer] Medicare    Authorization Time Period  07/23/18 to 08/20/18    Authorization - Visit Number  7    Authorization - Number of Visits  10    PT Start Time  8299    PT Stop Time  3716    PT Time Calculation (min)  21 min    Activity Tolerance  Patient tolerated treatment well;No increased pain    Behavior During Therapy  WFL for tasks assessed/performed       Past Medical History:  Diagnosis Date  . Depression   . Hypertension     Past Surgical History:  Procedure Laterality Date  . CESAREAN SECTION      There were no vitals filed for this visit.  Subjective Assessment - 08/17/18 1432    Subjective  Pt states that she had a little bit of pain this morning but it went away really quickly.     Currently in Pain?  No/denies         [facility] PT Assessment - 08/17/18 0001      Assessment   Medical Diagnosis  lumbar spondylosis/DDD - R LSS, radiculopathy, R hip OA    Referring Provider (PT)  Verner Chol, MD    Onset Date/Surgical Date  --   over a year ago   Next MD Visit  unsure   but will reschedule this   Prior Therapy   yes for hand, none for current issue      Strength   Right Hip Flexion  5/5   was 4+   Right Hip Extension  4/5   was 4-   Right Hip External Rotation   5/5   was 4+   Right Hip ABduction  4+/5   was 4   Left Hip Extension  4+/5   was 4   Left Hip ABduction  4/5   was 4-   Right Knee Flexion  4+/5   no pain, just felt working the mm; was 4/5 with pain   Left Knee Flexion  5/5   was 4     Flexibility   Soft Tissue Assessment /Muscle Length  yes    Hamstrings  90/90 - R:145deg without pain   was 121deg and recreated pain     Balance   Balance Assessed  Yes      Static Standing Balance   Static Standing - Balance Support  No upper extremity supported    Static Standing Balance -  Activities   Single Leg Stance - Right Leg    Static Standing - Comment/#  of Minutes  R:    was 1:00 with trunk lean          PT Education - 08/17/18 1504    Education Details  early reassessment and d/c plans due to progress made    Person(s) Educated  Patient    Methods  Explanation;Handout    Comprehension  Verbalized understanding         PT Short Term Goals - 08/17/18 1435      PT SHORT TERM GOAL #1   Title  Pt will report waking up with her RLE pain 4 mornings out of the week to demo improved overall tissue extensibility and improved overall function.    Baseline  3/6: has had a pretty good week this week    Time  2    Period  Weeks    Status  Achieved    Target Date  08/06/18      PT SHORT TERM GOAL #2   Title  Pt will be able to perform R SLS for 1:00 without R lateral trunk lean in order to demo improved functional hip and core strength.     Time  2    Period  Weeks    Status  Achieved      PT SHORT TERM GOAL #3   Title  Pt will have improved R HS length from 90/90 position to 130deg or > to demo reduced restrictions in the HS and reduced neural tension in sciatic nerve so as to reduce her R leg pain.     Baseline  3/6: 145deg, no pain    Status  Achieved         PT Long Term Goals - 08/17/18 1436      PT LONG TERM GOAL #1   Title  Pt will report waking up with her RLE pain 2 mornings out of the week to further demo improved overall function.    Baseline  3/6: varies, but had a good week this week    Time  4    Period  Weeks    Status  Partially Met      PT LONG TERM GOAL #2   Title  Pt will have improved MMT by 1/2 grade throughout all mm in order to reduce her pain and promote return to PLOF.     Baseline  3/6: see MMT    Time  4    Status  Achieved      PT LONG TERM GOAL #3   Title  Pt will report 3/10 RLE pain when she goes to stand after sitting for >1 hour in order to maximize her ability to drive and relax at home for longer periods of time without pan.     Baseline  3/6: "not very much pain at all"     Time  4    Period  Weeks    Status  Achieved            Plan - 08/17/18 1503    Clinical Impression Statement  Pt presented to therapy without pain and stating that she overall feels a great deal better since starting PT. PT inquired about early d/c since she has been doing so well and pt was agreeable to this plan. Pt has met all goals while meeting just 1 goal. She still wakes up with some pain in the mornings, but overall, it has drastically improved and the intensity and duration of it is much less than prior to therapy. PT's  strength has improved throughout, her R HS length has significantly improved without pain, and her balance greatly improved as she did not demo any trunk lean during R SLS. Provided pt with updated HEP to include stretching, hip and HS strengthening, as well as balance work for continued functional and core strength; pt verbalized understanding. Overall, pt had made tremendous progress and is ready for d/c at this time.    Rehab Potential  Good    PT Frequency  2x / week    PT Duration  4 weeks    PT Treatment/Interventions  ADLs/Self Care Home Management;Aquatic Therapy;Cryotherapy;Electrical  Stimulation;Moist Heat;Traction;Ultrasound;DME Instruction;Gait training;Stair training;Functional mobility training;Therapeutic activities;Therapeutic exercise;Balance training;Neuromuscular re-education;Patient/family education;Orthotic Fit/Training;Manual techniques;Scar mobilization;Passive range of motion;Dry needling;Energy conservation;Taping    PT Next Visit Plan  d/c to HEP    PT Home Exercise Plan  eval: supine HS stretch hands behind thigh, supine figure 4; 3 D hip excursion ; 08/08/18 - clamshell with green TB; 3/6: see below for extensive additions    Consulted and Agree with Plan of Care  Patient       Patient will benefit from skilled therapeutic intervention in order to improve the following deficits and impairments:  Decreased range of motion, Decreased strength, Hypomobility, Increased fascial restricitons, Increased muscle spasms, Impaired flexibility, Pain  Visit Diagnosis: Pain in right leg  Muscle weakness (generalized)  Other symptoms and signs involving the musculoskeletal system     Problem List Patient Active Problem List   Diagnosis Date Noted  . Acute appendicitis with peritoneal abscess 08/24/2015  . Essential hypertension 08/24/2015        Geraldine Solar PT, DPT  [facility] [address] [place], [place], [address] Phone: [phone]   Fax:  [phone]  Name: Deborah Stevenson MRN: [id number] Date of Birth: 05/03/1946

## 2018-08-17 NOTE — Patient Instructions (Signed)
Access Code: [email or web address]  URL: [email or web address]  Date: 08/17/2018  Prepared by: Jac Canavan   Exercises Child's Pose Stretch - 3-5 reps - 30-60seconds hold - 1-2x daily - 7x weekly Single Leg Bridge - 10 reps - 3 sets - 1x daily - 7x weekly Lunge with Counter Support - 10 reps - 3 sets - 1x daily - 7x weekly Side Stepping with Resistance at Ankles - 10 reps - 3 sets - 1x daily - 7x weekly Hip Abduction with Resistance Loop - 10 reps - 3 sets - 1x daily - 7x weekly Standing 3-Way Kick - 10 reps - 3 sets - 1x daily - 7x weekly Tandem Stance in Corner - 10 reps - 3 sets - as long as you can hold - 1x daily - 7x weekly Tandem Stance with Eyes Closed in Corner - 10 reps - 3 sets - 1x daily - 7x weekly Foam Balance Tandem stance - 10 reps - 3 sets - 1x daily - 7x weekly Hamstring Curl with Weight Machine - 10 reps - 3 sets - 1x daily - 7x weekly Single Leg Hamstring Curl with Weight Machine - 10 reps - 3 sets                            - 1x daily - 7x weekly Full Leg Press - 10 reps - 3 sets - 1x daily - 7x weekly

## 2018-08-21 ENCOUNTER — Ambulatory Visit (HOSPITAL_COMMUNITY): Payer: Medicare Other

## 2018-08-23 ENCOUNTER — Ambulatory Visit (HOSPITAL_COMMUNITY): Payer: Medicare Other | Admitting: Physical Therapy

## 2019-07-21 ENCOUNTER — Ambulatory Visit: Payer: Medicare PPO

## 2019-08-05 ENCOUNTER — Ambulatory Visit: Payer: Medicare Other

## 2019-11-04 DIAGNOSIS — Z86010 Personal history of colonic polyps: Secondary | ICD-10-CM | POA: Diagnosis not present

## 2019-11-04 DIAGNOSIS — R197 Diarrhea, unspecified: Secondary | ICD-10-CM | POA: Diagnosis not present

## 2019-12-18 DIAGNOSIS — Z1159 Encounter for screening for other viral diseases: Secondary | ICD-10-CM | POA: Diagnosis not present

## 2019-12-23 DIAGNOSIS — D122 Benign neoplasm of ascending colon: Secondary | ICD-10-CM | POA: Diagnosis not present

## 2019-12-23 DIAGNOSIS — K640 First degree hemorrhoids: Secondary | ICD-10-CM | POA: Diagnosis not present

## 2019-12-23 DIAGNOSIS — Z86010 Personal history of colonic polyps: Secondary | ICD-10-CM | POA: Diagnosis not present

## 2019-12-23 DIAGNOSIS — K5730 Diverticulosis of large intestine without perforation or abscess without bleeding: Secondary | ICD-10-CM | POA: Diagnosis not present

## 2019-12-26 ENCOUNTER — Other Ambulatory Visit: Payer: Self-pay | Admitting: Family Medicine

## 2019-12-26 DIAGNOSIS — J029 Acute pharyngitis, unspecified: Secondary | ICD-10-CM | POA: Diagnosis not present

## 2019-12-26 DIAGNOSIS — W57XXXA Bitten or stung by nonvenomous insect and other nonvenomous arthropods, initial encounter: Secondary | ICD-10-CM | POA: Diagnosis not present

## 2019-12-26 DIAGNOSIS — M791 Myalgia, unspecified site: Secondary | ICD-10-CM | POA: Diagnosis not present

## 2019-12-26 DIAGNOSIS — S20361A Insect bite (nonvenomous) of right front wall of thorax, initial encounter: Secondary | ICD-10-CM | POA: Diagnosis not present

## 2019-12-26 DIAGNOSIS — Z1231 Encounter for screening mammogram for malignant neoplasm of breast: Secondary | ICD-10-CM

## 2019-12-26 DIAGNOSIS — D122 Benign neoplasm of ascending colon: Secondary | ICD-10-CM | POA: Diagnosis not present

## 2019-12-26 DIAGNOSIS — R0982 Postnasal drip: Secondary | ICD-10-CM | POA: Diagnosis not present

## 2019-12-30 ENCOUNTER — Other Ambulatory Visit: Payer: Self-pay

## 2019-12-30 ENCOUNTER — Ambulatory Visit
Admission: RE | Admit: 2019-12-30 | Discharge: 2019-12-30 | Disposition: A | Discharge status: Home / Self Care | Payer: Medicare PPO | Source: Ambulatory Visit | Attending: Family Medicine | Admitting: Family Medicine

## 2019-12-30 DIAGNOSIS — Z1231 Encounter for screening mammogram for malignant neoplasm of breast: Secondary | ICD-10-CM | POA: Diagnosis not present

## 2020-03-09 DIAGNOSIS — M542 Cervicalgia: Secondary | ICD-10-CM | POA: Diagnosis not present

## 2020-03-09 DIAGNOSIS — H40011 Open angle with borderline findings, low risk, right eye: Secondary | ICD-10-CM | POA: Diagnosis not present

## 2020-03-09 DIAGNOSIS — H2589 Other age-related cataract: Secondary | ICD-10-CM | POA: Diagnosis not present

## 2020-03-09 DIAGNOSIS — Z23 Encounter for immunization: Secondary | ICD-10-CM | POA: Diagnosis not present

## 2020-03-09 DIAGNOSIS — H2513 Age-related nuclear cataract, bilateral: Secondary | ICD-10-CM | POA: Diagnosis not present

## 2020-03-19 ENCOUNTER — Other Ambulatory Visit: Payer: Self-pay

## 2020-03-19 ENCOUNTER — Ambulatory Visit (HOSPITAL_COMMUNITY): Payer: Medicare PPO | Attending: Family Medicine | Admitting: Physical Therapy

## 2020-03-19 ENCOUNTER — Encounter (HOSPITAL_COMMUNITY): Payer: Self-pay | Admitting: Physical Therapy

## 2020-03-19 DIAGNOSIS — R293 Abnormal posture: Secondary | ICD-10-CM | POA: Insufficient documentation

## 2020-03-19 DIAGNOSIS — M542 Cervicalgia: Secondary | ICD-10-CM | POA: Insufficient documentation

## 2020-03-19 NOTE — Therapy (Signed)
[facility] [address] [place], [place], [address] Phone: [phone]   Fax:  [phone]  Physical Therapy Evaluation  Patient Details  Name: Deborah Stevenson MRN: [id number] Date of Birth: 03-19-1946 Referring Provider (PT): Garth Bigness, MD   Encounter Date: 03/19/2020   PT End of Session - 03/19/20 1233    Visit Number 1    Number of Visits 8    Date for PT Re-Evaluation 04/30/20    Authorization Type humana medicare - auth required, no VL    Progress Note Due on Visit 10    PT Start Time 1133    PT Stop Time 1203    PT Time Calculation (min) 30 min    Activity Tolerance Patient tolerated treatment well           Past Medical History:  Diagnosis Date  . Depression   . Hypertension     Past Surgical History:  Procedure Laterality Date  . CESAREAN SECTION      There were no vitals filed for this visit.        [facility] PT Assessment - 03/19/20 0001      Assessment   Medical Diagnosis neck pain     Referring Provider (PT) Garth Bigness, MD      Balance Screen   Has the patient fallen in the past 6 months No      Cognition   Overall Cognitive Status Within Functional Limits for tasks assessed      ROM / Strength   AROM / PROM / Strength AROM;Strength      AROM   Overall AROM Comments WFL of shoulder Motion - discomfort in neck noted with movement    AROM Assessment Site Cervical    Cervical Flexion 30   pain in ears   Cervical Extension 25   pain across neck both sides    Cervical - Right Side Bend 18   pulling down left arm   Cervical - Left Side Bend 12   pulling on left    Cervical - Right Rotation 30   pain in neck    Cervical - Left Rotation 38   pain in neck      Palpation   Spinal mobility hypomobility noted in cervical spine     Palpation comment tenderness to palpation along bilateral SCM, cervical paraspinals and UT.                       Objective measurements  completed on examination: See above findings.       [facility] Adult PT Treatment/Exercise - 03/19/20 0001      Exercises   Exercises Neck      Neck Exercises: Supine   Cervical Rotation 20 reps   to tolerance   Other Supine Exercise self massage to SCM in supine                   PT Education - 03/19/20 1242    Education Details in current condition, posture and HEP.    Person(s) Educated Patient    Methods Explanation    Comprehension Verbalized understanding            PT Short Term Goals - 03/19/20 1234      PT SHORT TERM GOAL #1   Title Patient will be independent in self management strategies to improve quality of life and functional outcomes.    Time 3    Period Weeks  Status New    Target Date 04/09/20      PT SHORT TERM GOAL #2   Title Patient will report at least 50% improvement in overall symptoms and/or function to demonstrate improved functional mobility    Time 3    Period Weeks    Status New    Target Date 04/09/20      PT SHORT TERM GOAL #3   Title Patient will be able to rotate neck at least 45 degrees bilaterally to demonstrate improved cervical mobility    Time 3    Period Weeks    Status New    Target Date 04/09/20             PT Long Term Goals - 03/19/20 1234      PT LONG TERM GOAL #1   Title Patient will report at least 75% improvement in overall symptoms and/or function to demonstrate improved functional mobility    Time 6    Period Weeks    Status New    Target Date 04/30/20      PT LONG TERM GOAL #2   Title Patient will improve on FOTO score to meet predicted outcomes to demonstrate improved functional mobility.    Time 6    Period Weeks    Status New    Target Date 04/30/20      PT LONG TERM GOAL #3   Title Patient will be able to demonstrate painfree cervical ROM    Time 6    Period Weeks    Status New    Target Date 04/30/20                  Plan - 03/19/20 1236    Clinical Impression Statement  Patient presents with chronic neck pain that started earlier this year with limitations in cervical mobility that are affecting her ability to perform daily functional tasks. Patient with severe tenderness along bilateral SCM and UT. Educated patient in anatomy and self massage as well as posture and how this can contribute to current symptoms. Patient would greatly benefit from skilled physical therapy to improve functional mobility and return patient to optimal function.    Personal Factors and Comorbidities Comorbidity 1;Comorbidity 2    Comorbidities HTN, depression    Examination-Activity Limitations Carry;Sit;Lift    Examination-Participation Restrictions Driving;Cleaning;Other   sewing, reading   Stability/Clinical Decision Making Stable/Uncomplicated    Clinical Decision Making Low    Rehab Potential Good    PT Frequency Other (comment)   8 visits over 6 week certification period at 1-2 visits a week   PT Duration 6 weeks    PT Treatment/Interventions ADLs/Self Care Home Management;Aquatic Therapy;Cryotherapy;Electrical Stimulation;Moist Heat;Traction;Balance training;Therapeutic exercise;Therapeutic activities;Functional mobility training;Stair training;Gait training;Neuromuscular re-education;Patient/family education;Manual techniques;Dry needling;Passive range of motion;Joint Manipulations    PT Next Visit Plan STM to anterior cervical musculature and UT, posture exercises, cervical mobilty    PT Home Exercise Plan supine cervical ROT, self massage to SCM    Consulted and Agree with Plan of Care Patient           Patient will benefit from skilled therapeutic intervention in order to improve the following deficits and impairments:  Pain, Hypomobility, Decreased range of motion, Decreased strength, Postural dysfunction  Visit Diagnosis: Cervicalgia  Abnormal posture     Problem List Patient Active Problem List   Diagnosis Date Noted  . Acute appendicitis with peritoneal  abscess 08/24/2015  . Essential hypertension 08/24/2015    12:42 PM, 03/19/20 Elon Jester  Loletha Grayer, DPT Physical Therapy with [facility]  [phone] office   [facility] [address] [place], [place], [address] Phone: [phone]   Fax:  [phone]  Name: Montine Hight MRN: [id number] Date of Birth: 23-Jul-1945

## 2020-04-14 ENCOUNTER — Ambulatory Visit (HOSPITAL_COMMUNITY): Payer: Medicare PPO | Attending: Family Medicine | Admitting: Physical Therapy

## 2020-04-14 ENCOUNTER — Other Ambulatory Visit: Payer: Self-pay

## 2020-04-14 ENCOUNTER — Encounter (HOSPITAL_COMMUNITY): Payer: Self-pay | Admitting: Physical Therapy

## 2020-04-14 DIAGNOSIS — R293 Abnormal posture: Secondary | ICD-10-CM | POA: Insufficient documentation

## 2020-04-14 DIAGNOSIS — M542 Cervicalgia: Secondary | ICD-10-CM | POA: Diagnosis not present

## 2020-04-14 NOTE — Patient Instructions (Signed)
Access Code: [email or web address] URL: [email or web address] Date: 04/14/2020 Prepared by: Greig Castilla Georgi Tuel  Exercises Seated Scapular Retraction - 2 x daily - 7 x weekly - 2 sets - 10 reps Supine Chin Tuck - 2 x daily - 7 x weekly - 2 sets - 10 reps Shoulder External Rotation and Scapular Retraction - 2 x daily - 7 x weekly - 2 sets - 10 reps

## 2020-04-14 NOTE — Therapy (Signed)
[facility] [address] [place], [place], [address] Phone: [phone]   Fax:  [phone]  Physical Therapy Treatment  Patient Details  Name: Deborah Stevenson MRN: [id number] Date of Birth: 11-15-1945 Referring Provider (PT): Sela Hilding, MD   Encounter Date: 04/14/2020   PT End of Session - 04/14/20 0923    Visit Number 2    Number of Visits 8    Date for PT Re-Evaluation 04/30/20    Authorization Type humana medicare - auth required, no VL    Progress Note Due on Visit 10    PT Start Time 0924   late/delayed check in   PT Stop Time 0958    PT Time Calculation (min) 34 min    Activity Tolerance Patient tolerated treatment well    Behavior During Therapy [facility] for tasks assessed/performed           Past Medical History:  Diagnosis Date   Depression    Hypertension     Past Surgical History:  Procedure Laterality Date   CESAREAN SECTION      There were no vitals filed for this visit.   Subjective Assessment - 04/14/20 0924    Subjective Patient states symptoms come and go. She throughout today would be a good day and then it wasnt. Patient has been working on her home exercises.    Currently in Pain? Yes    Pain Score 5     Pain Location Neck    Pain Orientation Right;Left    Pain Descriptors / Indicators Sore    Pain Type Chronic pain    Pain Onset More than a month ago                             [facility] Adult PT Treatment/Exercise - 04/14/20 0001      Neck Exercises: Standing   Other Standing Exercises scap retraction with glenohumeral ER at wall 2x10      Neck Exercises: Seated   Other Seated Exercise scap retraction 2x10      Neck Exercises: Supine   Neck Retraction 10 reps;3 secs    Neck Retraction Limitations 2 sets      Manual Therapy   Manual Therapy Joint mobilization;Soft tissue mobilization    Manual therapy comments done seperate from all other aspects of treatment      Joint Mobilization R and L UPA C3-C6 grade II-III    Soft tissue mobilization STM to cervical paraspinals, scm and scalenes bilateral; Self STM with ball to cervical paraspinals                  PT Education - 04/14/20 0923    Education Details Patient educated on HEP, exercise mechanics, self STM, posture    Person(s) Educated Patient    Methods Explanation;Demonstration;Handout    Comprehension Verbalized understanding;Returned demonstration            PT Short Term Goals - 03/19/20 1234      PT SHORT TERM GOAL #1   Title Patient will be independent in self management strategies to improve quality of life and functional outcomes.    Time 3    Period Weeks    Status New    Target Date 04/09/20      PT SHORT TERM GOAL #2   Title Patient will report at least 50% improvement in overall symptoms and/or function to demonstrate improved functional mobility  Time 3    Period Weeks    Status New    Target Date 04/09/20      PT SHORT TERM GOAL #3   Title Patient will be able to rotate neck at least 45 degrees bilaterally to demonstrate improved cervical mobility    Time 3    Period Weeks    Status New    Target Date 04/09/20             PT Long Term Goals - 03/19/20 1234      PT LONG TERM GOAL #1   Title Patient will report at least 75% improvement in overall symptoms and/or function to demonstrate improved functional mobility    Time 6    Period Weeks    Status New    Target Date 04/30/20      PT LONG TERM GOAL #2   Title Patient will improve on FOTO score to meet predicted outcomes to demonstrate improved functional mobility.    Time 6    Period Weeks    Status New    Target Date 04/30/20      PT LONG TERM GOAL #3   Title Patient will be able to demonstrate painfree cervical ROM    Time 6    Period Weeks    Status New    Target Date 04/30/20                 Plan - 04/14/20 0924    Clinical Impression Statement Patient has hypomobile  cervical spine and tender musculature throughout with hyperactive paraspinals and L SCM. She tolerates manual therapy well with slight improvement in symptoms following. She requires verbal and tactile cueing initially with cervical retraction in supine with good carry over with mechanics. Patient educated on self STM with ball for relaxing hyperactive muscles throughout cervical spine. Patient completes scapular retraction with glenohumeral ER at wall for postural cueing with good mechanics and is educated on decreasing UT activation. Patient feeling improvement at end of session. Patient will continue to benefit from skilled physical therapy in order to reduce impairment and improve function.    Personal Factors and Comorbidities Comorbidity 1;Comorbidity 2    Comorbidities HTN, depression    Examination-Activity Limitations Carry;Sit;Lift    Examination-Participation Restrictions Driving;Cleaning;Other   sewing, reading   Stability/Clinical Decision Making Stable/Uncomplicated    Rehab Potential Good    PT Frequency Other (comment)   8 visits over 6 week certification period at 1-2 visits a week   PT Duration 6 weeks    PT Treatment/Interventions ADLs/Self Care Home Management;Aquatic Therapy;Cryotherapy;Electrical Stimulation;Moist Heat;Traction;Balance training;Therapeutic exercise;Therapeutic activities;Functional mobility training;Stair training;Gait training;Neuromuscular re-education;Patient/family education;Manual techniques;Dry needling;Passive range of motion;Joint Manipulations    PT Next Visit Plan STM to paraspinals and UT, posture exercises, cervical mobilty    PT Home Exercise Plan supine cervical ROT, self massage to [facility] 11/2 c/sp ret supine, scap ret, scap ret at wall with ER    Consulted and Agree with Plan of Care Patient           Patient will benefit from skilled therapeutic intervention in order to improve the following deficits and impairments:  Pain, Hypomobility,  Decreased range of motion, Decreased strength, Postural dysfunction  Visit Diagnosis: Cervicalgia  Abnormal posture     Problem List Patient Active Problem List   Diagnosis Date Noted   Acute appendicitis with peritoneal abscess 08/24/2015   Essential hypertension 08/24/2015    10:02 AM, 04/14/20 Mearl Latin PT, DPT Physical Therapist at  [facility] [address] [place], [place], [address] Phone: [phone]   Fax:  [phone]  Name: Deborah Stevenson MRN: [id number] Date of Birth: [birth year]

## 2020-04-16 ENCOUNTER — Other Ambulatory Visit: Payer: Self-pay

## 2020-04-16 ENCOUNTER — Ambulatory Visit (HOSPITAL_COMMUNITY): Payer: Medicare PPO | Admitting: Physical Therapy

## 2020-04-16 DIAGNOSIS — M542 Cervicalgia: Secondary | ICD-10-CM

## 2020-04-16 DIAGNOSIS — R293 Abnormal posture: Secondary | ICD-10-CM

## 2020-04-16 NOTE — Therapy (Signed)
[place], [place], [address] Phone: [phone]   Fax:  [phone]  Physical Therapy Treatment  Patient Details  Name: Deborah Stevenson MRN: [id number] Date of Birth: December 15, 1945 Referring Provider (PT): Sela Hilding, MD   Encounter Date: 04/16/2020   PT End of Session - 04/16/20 1420    Visit Number 3    Number of Visits 8    Date for PT Re-Evaluation 04/30/20    Authorization Type human[employer] medic[employer] - auth required, no VL    Progress Note Due on Visit 10    PT Start Time 1050    PT Stop Time 1130    PT Time Calculation (min) 40 min    Activity Tolerance Patient tolerated treatment well    Behavior During Therapy [facility] for tasks assessed/performed           Past Medical History:  Diagnosis Date  . Depression   . Hypertension     Past Surgical History:  Procedure Laterality Date  . CESAREAN SECTION      There were no vitals filed for this visit.   Subjective Assessment - 04/16/20 1055    Subjective pt states her pain is mostly unchanged.  States she has radiating pain up into the top of her head on the Lt side. Currently 4/10    Currently in Pain? Yes    Pain Score 4     Pain Location Neck    Pain Orientation Left;Right    Pain Descriptors / Indicators Aching;Sore;Throbbing                             [facility] Adult PT Treatment/Exercise - 04/16/20 0001      Neck Exercises: Seated   Neck Retraction 10 reps;5 secs    Shoulder Rolls Backwards;10 reps    Shoulder Flexion Both;10 reps    Other Seated Exercise scap retraction 2x10      Neck Exercises: Supine   Neck Retraction 10 reps;3 secs    Neck Retraction Limitations 2 sets      Manual Therapy   Manual Therapy Manual Traction;Soft tissue mobilization;Myofascial release    Manual therapy comments done seperate from all other aspects of treatment     Soft tissue mobilization STM to cervical paraspinals, scm and scalenes  bilateral supine and sitting    Myofascial Release occipital release    Manual Traction 3X10"                     PT Short Term Goals - 03/19/20 1234      PT SHORT TERM GOAL #1   Title Patient will be independent in self management strategies to improve quality of life and functional outcomes.    Time 3    Period Weeks    Status New    Target Date 04/09/20      PT SHORT TERM GOAL #2   Title Patient will report at least 50% improvement in overall symptoms and/or function to demonstrate improved functional mobility    Time 3    Period Weeks    Status New    Target Date 04/09/20      PT SHORT TERM GOAL #3   Title Patient will be able to rotate neck at least 45 degrees bilaterally to demonstrate improved cervical mobility    Time 3    Period Weeks    Status New    Target  Date 04/09/20             PT Long Term Goals - 03/19/20 1234      PT LONG TERM GOAL #1   Title Patient will report at least 75% improvement in overall symptoms and/or function to demonstrate improved functional mobility    Time 6    Period Weeks    Status New    Target Date 04/30/20      PT LONG TERM GOAL #2   Title Patient will improve on FOTO score to meet predicted outcomes to demonstrate improved functional mobility.    Time 6    Period Weeks    Status New    Target Date 04/30/20      PT LONG TERM GOAL #3   Title Patient will be able to demonstrate painfree cervical ROM    Time 6    Period Weeks    Status New    Target Date 04/30/20                 Plan - 04/16/20 1420    Clinical Impression Statement Continued with postural exercises, cervical strengthening and mobility.  PT able to complete with generally good form.  Very little movement achieved with cervical retractions.  Completed manual in sitting initially with general tightness in Rt upper trap and large spasm located in Lt upper trap.  Able to reduce spasm and completed gentle occipital release and cervical  traction in supine.  Pt reported overll improvement following manual.  Also with noted tight cervical paraspinals and levator mm.    Personal Factors and Comorbidities Comorbidity 1;Comorbidity 2    Comorbidities HTN, depression    Examination-Activity Limitations Carry;Sit;Lift    Examination-Participation Restrictions Driving;Cleaning;Other   sewing, reading   Stability/Clinical Decision Making Stable/Uncomplicated    Rehab Potential Good    PT Frequency Other (comment)   8 visits over 6 week certification period at 1-2 visits a week   PT Duration 6 weeks    PT Treatment/Interventions ADLs/Self Care Home Management;Aquatic Therapy;Cryotherapy;Electrical Stimulation;Moist Heat;Traction;Balance training;Therapeutic exercise;Therapeutic activities;Functional mobility training;Stair training;Gait training;Neuromuscular re-education;Patient/family education;Manual techniques;Dry needling;Passive range of motion;Joint Manipulations    PT Next Visit Plan STM to paraspinals and UT, posture exercises, cervical mobilty    PT Home Exercise Plan supine cervical ROT, self massage to [facility] 11/2 c/sp ret supine, scap ret, scap ret at wall with ER    Consulted and Agree with Plan of Care Patient           Patient will benefit from skilled therapeutic intervention in order to improve the following deficits and impairments:  Pain, Hypomobility, Decreased range of motion, Decreased strength, Postural dysfunction  Visit Diagnosis: Cervicalgia  Abnormal posture     Problem List Patient Active Problem List   Diagnosis Date Noted  . Acute appendicitis with peritoneal abscess 08/24/2015  . Essential hypertension 08/24/2015   Deborah Stevenson, PTA/CLT [phone]  Deborah Stevenson 04/16/2020, 2:22 PM  [facility] [address] [place], [place], [address] Phone: [phone]   Fax:  [phone]  Name: Deborah Stevenson MRN: [id number] Date of Birth:  05-04-1946

## 2020-04-20 DIAGNOSIS — I8311 Varicose veins of right lower extremity with inflammation: Secondary | ICD-10-CM | POA: Diagnosis not present

## 2020-04-20 DIAGNOSIS — Z85828 Personal history of other malignant neoplasm of skin: Secondary | ICD-10-CM | POA: Diagnosis not present

## 2020-04-20 DIAGNOSIS — I872 Venous insufficiency (chronic) (peripheral): Secondary | ICD-10-CM | POA: Diagnosis not present

## 2020-04-20 DIAGNOSIS — I8312 Varicose veins of left lower extremity with inflammation: Secondary | ICD-10-CM | POA: Diagnosis not present

## 2020-04-20 DIAGNOSIS — L821 Other seborrheic keratosis: Secondary | ICD-10-CM | POA: Diagnosis not present

## 2020-04-20 DIAGNOSIS — D2261 Melanocytic nevi of right upper limb, including shoulder: Secondary | ICD-10-CM | POA: Diagnosis not present

## 2020-04-20 DIAGNOSIS — D1801 Hemangioma of skin and subcutaneous tissue: Secondary | ICD-10-CM | POA: Diagnosis not present

## 2020-04-21 ENCOUNTER — Other Ambulatory Visit: Payer: Self-pay

## 2020-04-21 ENCOUNTER — Ambulatory Visit (HOSPITAL_COMMUNITY): Payer: Medicare PPO | Admitting: Physical Therapy

## 2020-04-21 DIAGNOSIS — R293 Abnormal posture: Secondary | ICD-10-CM

## 2020-04-21 DIAGNOSIS — M542 Cervicalgia: Secondary | ICD-10-CM | POA: Diagnosis not present

## 2020-04-21 NOTE — Therapy (Signed)
[facility] [address] [place], [place], [address] Phone: [phone]   Fax:  [phone]  Physical Therapy Treatment  Patient Details  Name: Deborah Stevenson MRN: [id number] Date of Birth: June 16, 1945 Referring Provider (PT): Sela Hilding, MD   Encounter Date: 04/21/2020   PT End of Session - 04/21/20 1146    Visit Number 4    Number of Visits 8    Date for PT Re-Evaluation 04/30/20    Authorization Type human[employer] medic[employer] - auth required, no VL    Progress Note Due on Visit 10    PT Start Time 1051    PT Stop Time 1138    PT Time Calculation (min) 47 min    Activity Tolerance Patient tolerated treatment well    Behavior During Therapy [facility] for tasks assessed/performed           Past Medical History:  Diagnosis Date  . Depression   . Hypertension     Past Surgical History:  Procedure Laterality Date  . CESAREAN SECTION      There were no vitals filed for this visit.   Subjective Assessment - 04/21/20 1112    Subjective Pt believes the combination of neproxin along with the therapy is helping with her pain and symptoms.    Currently in Pain? No/denies                             [facility] Adult PT Treatment/Exercise - 04/21/20 0001      Neck Exercises: Theraband   Scapula Retraction 10 reps;Green    Shoulder Extension 10 reps;Green    Rows 10 reps;Green    Other Theraband Exercises UE flexion stretch facing wall 10X10", UE flexion back against wall 10X      Neck Exercises: Seated   Neck Retraction 10 reps;5 secs    Shoulder Rolls Backwards;10 reps    Shoulder Flexion Both;10 reps    Other Seated Exercise lateral flexion and rotation 10X each      Neck Exercises: Supine   Neck Retraction 10 reps;3 secs    Neck Retraction Limitations 2 sets    Cervical Rotation 20 reps      Manual Therapy   Manual Therapy Manual Traction;Soft tissue mobilization;Myofascial release    Manual therapy  comments done seperate from all other aspects of treatment     Soft tissue mobilization STM to cervical paraspinals, scm and scalenes bilateral supine and sitting    Myofascial Release occipital release    Manual Traction 3X10"                     PT Short Term Goals - 03/19/20 1234      PT SHORT TERM GOAL #1   Title Patient will be independent in self management strategies to improve quality of life and functional outcomes.    Time 3    Period Weeks    Status New    Target Date 04/09/20      PT SHORT TERM GOAL #2   Title Patient will report at least 50% improvement in overall symptoms and/or function to demonstrate improved functional mobility    Time 3    Period Weeks    Status New    Target Date 04/09/20      PT SHORT TERM GOAL #3   Title Patient will be able to rotate neck at least 45 degrees bilaterally to demonstrate improved cervical mobility  Time 3    Period Weeks    Status New    Target Date 04/09/20             PT Long Term Goals - 03/19/20 1234      PT LONG TERM GOAL #1   Title Patient will report at least 75% improvement in overall symptoms and/or function to demonstrate improved functional mobility    Time 6    Period Weeks    Status New    Target Date 04/30/20      PT LONG TERM GOAL #2   Title Patient will improve on FOTO score to meet predicted outcomes to demonstrate improved functional mobility.    Time 6    Period Weeks    Status New    Target Date 04/30/20      PT LONG TERM GOAL #3   Title Patient will be able to demonstrate painfree cervical ROM    Time 6    Period Weeks    Status New    Target Date 04/30/20                 Plan - 04/21/20 1146    Clinical Impression Statement Continued focus on improving cervical ROM, postural strength and overall pain reduction.  Added theraband strengthening exercises with good form and minimal cues needed.  UE wall flexion and forward reach/stretch at wall were also added with  good ROM obtained.  Cervical retraction continues to be most limited as well as Lt rotation and lt lateral flexion.  Tightness/stiffness in cervical musculature persists but responsive to manual therapy.  Pt reported vast improvement "best It's ever felt" following manual with no pain at end of session.    Personal Factors and Comorbidities Comorbidity 1;Comorbidity 2    Comorbidities HTN, depression    Examination-Activity Limitations Carry;Sit;Lift    Examination-Participation Restrictions Driving;Cleaning;Other   sewing, reading   Stability/Clinical Decision Making Stable/Uncomplicated    Rehab Potential Good    PT Frequency Other (comment)   8 visits over 6 week certification period at 1-2 visits a week   PT Duration 6 weeks    PT Treatment/Interventions ADLs/Self Care Home Management;Aquatic Therapy;Cryotherapy;Electrical Stimulation;Moist Heat;Traction;Balance training;Therapeutic exercise;Therapeutic activities;Functional mobility training;Stair training;Gait training;Neuromuscular re-education;Patient/family education;Manual techniques;Dry needling;Passive range of motion;Joint Manipulations    PT Next Visit Plan STM to paraspinals and UT, posture exercises, cervical mobilty    PT Home Exercise Plan supine cervical ROT, self massage to [facility] 11/2 c/sp ret supine, scap ret, scap ret at wall with ER    Consulted and Agree with Plan of Care Patient           Patient will benefit from skilled therapeutic intervention in order to improve the following deficits and impairments:  Pain, Hypomobility, Decreased range of motion, Decreased strength, Postural dysfunction  Visit Diagnosis: Cervicalgia  Abnormal posture     Problem List Patient Active Problem List   Diagnosis Date Noted  . Acute appendicitis with peritoneal abscess 08/24/2015  . Essential hypertension 08/24/2015   Teena Irani, PTA/CLT [phone]  Teena Irani 04/21/2020, 11:47 AM  [facility] [address] [place], [place], [address] Phone: [phone]   Fax:  [phone]  Name: Deborah Stevenson MRN: [id number] Date of Birth: 01-23-1946

## 2020-04-23 ENCOUNTER — Ambulatory Visit (HOSPITAL_COMMUNITY): Payer: Medicare PPO | Admitting: Physical Therapy

## 2020-04-23 ENCOUNTER — Other Ambulatory Visit: Payer: Self-pay

## 2020-04-23 DIAGNOSIS — R293 Abnormal posture: Secondary | ICD-10-CM | POA: Diagnosis not present

## 2020-04-23 DIAGNOSIS — M542 Cervicalgia: Secondary | ICD-10-CM | POA: Diagnosis not present

## 2020-04-23 NOTE — Therapy (Signed)
[place], [place], [address] Phone: [phone]   Fax:  [phone]  Physical Therapy Treatment  Patient Details  Name: Deborah Stevenson MRN: [id number] Date of Birth: [birth year] Referring Provider (PT): Sela Hilding, MD   Encounter Date: 04/23/2020   PT End of Session - 04/23/20 1332    Visit Number 5    Number of Visits 8    Date for PT Re-Evaluation 04/30/20    Authorization Type humana medicare - auth required, no VL    Progress Note Due on Visit 10    PT Start Time 1052    PT Stop Time 1135    PT Time Calculation (min) 43 min    Activity Tolerance Patient tolerated treatment well    Behavior During Therapy [facility] for tasks assessed/performed           Past Medical History:  Diagnosis Date  . Depression   . Hypertension     Past Surgical History:  Procedure Laterality Date  . CESAREAN SECTION      There were no vitals filed for this visit.   Subjective Assessment - 04/23/20 1100    Subjective pt states she still has that pain in her lt side up into her neck. States it felt great after her last appointment but did not last long before it returned.    Currently in Pain? Yes    Pain Score 3     Pain Location Neck    Pain Orientation Left                             [facility] Adult PT Treatment/Exercise - 04/23/20 0001      Neck Exercises: Theraband   Scapula Retraction 10 reps;Green    Shoulder Extension 10 reps;Green    Rows 10 reps;Green    Other Theraband Exercises UE flexion stretch facing wall 10X10", UE flexion back against wall 10X      Neck Exercises: Seated   Neck Retraction 10 reps;5 secs    Other Seated Exercise lateral flexion and rotation 10X each    Other Seated Exercise thoracic excursions with UE movements 5X      Manual Therapy   Manual Therapy Myofascial release;Soft tissue mobilization    Manual therapy comments done seperate from all other  aspects of treatment     Soft tissue mobilization STM to cervical paraspinals, scm and scalenes bilateral sitting; Lt>RT                    PT Short Term Goals - 03/19/20 1234      PT SHORT TERM GOAL #1   Title Patient will be independent in self management strategies to improve quality of life and functional outcomes.    Time 3    Period Weeks    Status New    Target Date 04/09/20      PT SHORT TERM GOAL #2   Title Patient will report at least 50% improvement in overall symptoms and/or function to demonstrate improved functional mobility    Time 3    Period Weeks    Status New    Target Date 04/09/20      PT SHORT TERM GOAL #3   Title Patient will be able to rotate neck at least 45 degrees bilaterally to demonstrate improved cervical mobility    Time 3    Period Weeks    Status New  Target Date 04/09/20             PT Long Term Goals - 03/19/20 1234      PT LONG TERM GOAL #1   Title Patient will report at least 75% improvement in overall symptoms and/or function to demonstrate improved functional mobility    Time 6    Period Weeks    Status New    Target Date 04/30/20      PT LONG TERM GOAL #2   Title Patient will improve on FOTO score to meet predicted outcomes to demonstrate improved functional mobility.    Time 6    Period Weeks    Status New    Target Date 04/30/20      PT LONG TERM GOAL #3   Title Patient will be able to demonstrate painfree cervical ROM    Time 6    Period Weeks    Status New    Target Date 04/30/20                 Plan - 04/23/20 1346    Clinical Impression Statement Continued with focus on improving postural strength and stability.  Began thoracic excursions today with more limitations completing rotations.  Tightness all in Lt Upper trap so continued focus of manual in this area.  Large spasm UT resolved approx. 75%.  Pt reported overall improvement at EOS    Personal Factors and Comorbidities Comorbidity  1;Comorbidity 2    Comorbidities HTN, depression    Examination-Activity Limitations Carry;Sit;Lift    Examination-Participation Restrictions Driving;Cleaning;Other   sewing, reading   Stability/Clinical Decision Making Stable/Uncomplicated    Rehab Potential Good    PT Frequency Other (comment)   8 visits over 6 week certification period at 1-2 visits a week   PT Duration 6 weeks    PT Treatment/Interventions ADLs/Self Care Home Management;Aquatic Therapy;Cryotherapy;Electrical Stimulation;Moist Heat;Traction;Balance training;Therapeutic exercise;Therapeutic activities;Functional mobility training;Stair training;Gait training;Neuromuscular re-education;Patient/family education;Manual techniques;Dry needling;Passive range of motion;Joint Manipulations    PT Next Visit Plan STM to paraspinals and UT, posture exercises, cervical mobilty    PT Home Exercise Plan supine cervical ROT, self massage to [facility] 11/2 c/sp ret supine, scap ret, scap ret at wall with ER    Consulted and Agree with Plan of Care Patient           Patient will benefit from skilled therapeutic intervention in order to improve the following deficits and impairments:  Pain, Hypomobility, Decreased range of motion, Decreased strength, Postural dysfunction  Visit Diagnosis: Abnormal posture  Cervicalgia     Problem List Patient Active Problem List   Diagnosis Date Noted  . Acute appendicitis with peritoneal abscess 08/24/2015  . Essential hypertension 08/24/2015   Teena Irani, PTA/CLT [phone]  Teena Irani 04/23/2020, 1:48 PM  [place] [address] [place], [place], [address] Phone: [phone]   Fax:  [phone]  Name: Deborah Stevenson MRN: [id number] Date of Birth: 1946-01-26

## 2020-04-24 DIAGNOSIS — H25811 Combined forms of age-related cataract, right eye: Secondary | ICD-10-CM | POA: Diagnosis not present

## 2020-04-28 ENCOUNTER — Other Ambulatory Visit: Payer: Self-pay

## 2020-04-28 ENCOUNTER — Ambulatory Visit (HOSPITAL_COMMUNITY): Payer: Medicare PPO | Admitting: Physical Therapy

## 2020-04-28 DIAGNOSIS — R293 Abnormal posture: Secondary | ICD-10-CM

## 2020-04-28 DIAGNOSIS — M542 Cervicalgia: Secondary | ICD-10-CM

## 2020-04-28 NOTE — Therapy (Signed)
[place], [place], [address] Phone: [phone]   Fax:  [phone]  Physical Therapy Treatment  Patient Details  Name: Deborah Stevenson MRN: [id number] Date of Birth: 1945-12-19 Referring Provider (PT): Sela Hilding, MD   Encounter Date: 04/28/2020   PT End of Session - 04/28/20 1343    Visit Number 6    Number of Visits 8    Date for PT Re-Evaluation 04/30/20    Authorization Type humana medicare - auth required, no VL    Progress Note Due on Visit 10    Activity Tolerance Patient tolerated treatment well    Behavior During Therapy [facility] for tasks assessed/performed           Past Medical History:  Diagnosis Date  . Depression   . Hypertension     Past Surgical History:  Procedure Laterality Date  . CESAREAN SECTION      There were no vitals filed for this visit.   Subjective Assessment - 04/28/20 1147    Subjective pt reports all her pain is higher today pointing to Lt occiput.  Pain is currently 3/10 in this area without radiating symptoms.  States she had cataract surgery on her Rt eye Friday.    Currently in Pain? Yes    Pain Score 3     Pain Location Neck    Pain Orientation Left    Pain Descriptors / Indicators Sore;Stabbing    Pain Type Chronic pain    Aggravating Factors  turning head to Lt>Rt increases stabbing pain.                             [facility] Adult PT Treatment/Exercise - 04/28/20 0001      Neck Exercises: Theraband   Scapula Retraction 15 reps;Green    Shoulder Extension 15 reps;Green    Rows 15 reps;Green    Other Theraband Exercises UE flexion stretch facing wall 10X10", UE flexion back against wall 10X      Neck Exercises: Seated   Neck Retraction 10 reps;5 secs    Other Seated Exercise lateral flexion and rotation 10X each    Other Seated Exercise thoracic excursions with UE movements 5X      Neck Exercises: Supine   Neck Retraction 10  reps;3 secs      Manual Therapy   Manual Therapy Myofascial release;Soft tissue mobilization    Manual therapy comments done seperate from all other aspects of treatment     Soft tissue mobilization STM to cervical paraspinals, scm and scalenes bilateral sitting; Lt>RT    Myofascial Release occipital release    Manual Traction 3X10"                     PT Short Term Goals - 03/19/20 1234      PT SHORT TERM GOAL #1   Title Patient will be independent in self management strategies to improve quality of life and functional outcomes.    Time 3    Period Weeks    Status New    Target Date 04/09/20      PT SHORT TERM GOAL #2   Title Patient will report at least 50% improvement in overall symptoms and/or function to demonstrate improved functional mobility    Time 3    Period Weeks    Status New    Target Date 04/09/20      PT SHORT TERM  GOAL #3   Title Patient will be able to rotate neck at least 45 degrees bilaterally to demonstrate improved cervical mobility    Time 3    Period Weeks    Status New    Target Date 04/09/20             PT Long Term Goals - 03/19/20 1234      PT LONG TERM GOAL #1   Title Patient will report at least 75% improvement in overall symptoms and/or function to demonstrate improved functional mobility    Time 6    Period Weeks    Status New    Target Date 04/30/20      PT LONG TERM GOAL #2   Title Patient will improve on FOTO score to meet predicted outcomes to demonstrate improved functional mobility.    Time 6    Period Weeks    Status New    Target Date 04/30/20      PT LONG TERM GOAL #3   Title Patient will be able to demonstrate painfree cervical ROM    Time 6    Period Weeks    Status New    Target Date 04/30/20                 Plan - 04/28/20 1344    Clinical Impression Statement Pt with pinpoint pain today at Lt occiput. Continued with established therex all without difficulty.  Focused manual on occipital  region and upper cervical this session.   Some general tightness but no spasms or limitations palpated.  Pt reported overall relief at end of session.    Personal Factors and Comorbidities Comorbidity 1;Comorbidity 2    Comorbidities HTN, depression    Examination-Activity Limitations Carry;Sit;Lift    Examination-Participation Restrictions Driving;Cleaning;Other   sewing, reading   Stability/Clinical Decision Making Stable/Uncomplicated    Rehab Potential Good    PT Frequency Other (comment)   8 visits over 6 week certification period at 1-2 visits a week   PT Duration 6 weeks    PT Treatment/Interventions ADLs/Self Care Home Management;Aquatic Therapy;Cryotherapy;Electrical Stimulation;Moist Heat;Traction;Balance training;Therapeutic exercise;Therapeutic activities;Functional mobility training;Stair training;Gait training;Neuromuscular re-education;Patient/family education;Manual techniques;Dry needling;Passive range of motion;Joint Manipulations    PT Next Visit Plan Re-evaluate next visit.    PT Home Exercise Plan supine cervical ROT, self massage to [facility] 11/2 c/sp ret supine, scap ret, scap ret at wall with ER    Consulted and Agree with Plan of Care Patient           Patient will benefit from skilled therapeutic intervention in order to improve the following deficits and impairments:  Pain, Hypomobility, Decreased range of motion, Decreased strength, Postural dysfunction  Visit Diagnosis: Cervicalgia  Abnormal posture     Problem List Patient Active Problem List   Diagnosis Date Noted  . Acute appendicitis with peritoneal abscess 08/24/2015  . Essential hypertension 08/24/2015   Teena Irani, PTA/CLT [phone]  Teena Irani 04/28/2020, 1:47 PM  [place] [address] [place], [place], [address] Phone: [phone]   Fax:  [phone]  Name: Deborah Stevenson MRN: [id number] Date of Birth: Nov 03, 1945

## 2020-04-30 ENCOUNTER — Encounter (HOSPITAL_COMMUNITY): Payer: Self-pay | Admitting: Physical Therapy

## 2020-04-30 ENCOUNTER — Other Ambulatory Visit: Payer: Self-pay

## 2020-04-30 ENCOUNTER — Ambulatory Visit (HOSPITAL_COMMUNITY): Payer: Medicare PPO | Admitting: Physical Therapy

## 2020-04-30 DIAGNOSIS — M542 Cervicalgia: Secondary | ICD-10-CM

## 2020-04-30 DIAGNOSIS — R293 Abnormal posture: Secondary | ICD-10-CM

## 2020-04-30 NOTE — Therapy (Signed)
[facility] [address] [place], [place], [address] Phone: [phone]   Fax:  [phone]  Physical Therapy Treatment and Progress Note and Discharge Note  Patient Details  Name: Deborah Stevenson Patient MRN: [id number] Date of Birth: 1946-01-30 Referring Provider (PT): Sela Hilding, MD  Progress Note Reporting Period 03/19/20 to 04/30/20  See note below for Objective Data and Assessment of Progress/Goals.   PHYSICAL THERAPY DISCHARGE SUMMARY  Visits from Start of Care: 7  Current functional level related to goals / functional outcomes: 2/3 short term goals and 0/3 long term goals met   Remaining deficits: Continued pain and ROM deficits   Education / Equipment: See below Plan: Patient agrees to discharge.  Patient goals were partially met. Patient is being discharged due to being pleased with the current functional level.  ?????         Encounter Date: 04/30/2020   PT End of Session - 04/30/20 1053    Visit Number 7    Number of Visits 8    Date for PT Re-Evaluation 04/30/20    Authorization Type [employer] - auth required, no VL    Progress Note Due on Visit 10    PT Start Time 1055   pt late to session   PT Stop Time 1120    PT Time Calculation (min) 25 min    Activity Tolerance Patient tolerated treatment well    Behavior During Therapy WFL for tasks assessed/performed           Past Medical History:  Diagnosis Date  . Depression   . Hypertension     Past Surgical History:  Procedure Laterality Date  . CESAREAN SECTION      There were no vitals filed for this visit.   Subjective Assessment - 04/30/20 1120    Subjective pt reports all her pain is higher today pointing to Lt occiput.  Pain is currently 3/10 in this area without radiating symptoms.  States she had cataract surgery on her Rt eye Friday.    Currently in Pain? Yes    Pain Score 3     Pain Location Neck    Pain Orientation Upper     Pain Descriptors / Indicators Sore    Pain Type Chronic pain              [facility] PT Assessment - 04/30/20 0001      Assessment   Medical Diagnosis neck pain     Referring Provider (PT) Sela Hilding, MD      Observation/Other Assessments   Focus on Therapeutic Outcomes (FOTO)  52 % function, predicted 58% function   was 46% function     AROM   Cervical Flexion 38   no symptoms   Cervical Extension 40   soreness in back    Cervical - Right Side Bend 22   pain with movement   Cervical - Left Side Bend 18   pain with movement   Cervical - Right Rotation 52   painin neck - soreness    Cervical - Left Rotation 40   pain in back of neck and top of head                                 PT Education - 04/30/20 1121    Education Details Educated patient in use of percussion gun with towels in between, local massage therapists, looking for  deals on [employer]. FOTO results and answered all questions about current POC and DC.    Person(s) Educated Patient    Methods Explanation    Comprehension Verbalized understanding            PT Short Term Goals - 04/30/20 1101      PT SHORT TERM GOAL #1   Title Patient will be independent in self management strategies to improve quality of life and functional outcomes.    Baseline performs every other day    Time 3    Period Weeks    Status Achieved    Target Date 04/09/20      PT SHORT TERM GOAL #2   Title Patient will report at least 50% improvement in overall symptoms and/or function to demonstrate improved functional mobility    Baseline 50% better    Time 3    Period Weeks    Status Achieved    Target Date 04/09/20      PT SHORT TERM GOAL #3   Title Patient will be able to rotate neck at least 45 degrees bilaterally to demonstrate improved cervical mobility    Time 3    Period Weeks    Status On-going    Target Date 04/09/20             PT Long Term Goals - 04/30/20 1101      PT LONG TERM GOAL  #1   Title Patient will report at least 75% improvement in overall symptoms and/or function to demonstrate improved functional mobility    Baseline 50% limited    Time 6    Period Weeks    Status On-going      PT LONG TERM GOAL #2   Title Patient will improve on FOTO score to meet predicted outcomes to demonstrate improved functional mobility.    Baseline see objective    Time 6    Period Weeks    Status On-going      PT LONG TERM GOAL #3   Title Patient will be able to demonstrate painfree cervical ROM    Time 6    Period Weeks    Status On-going                 Plan - 04/30/20 1117    Clinical Impression Statement Patient has improved in neck ROM and overall symptoms with symptoms more localized at base of head instead of radiating into shoulders. Patient confident in current ability to perform HEP and interested in getting monthly massages to manage symptoms. Provided patient with list of local massage therapist and answered all questions on this date. 2/3 short term goals met and 0/3 long term goals met. Patient to discharge from PT to HEP at this time secondary to satisfaction with current progress.    Personal Factors and Comorbidities Comorbidity 1;Comorbidity 2    Comorbidities HTN, depression    Examination-Activity Limitations Carry;Sit;Lift    Examination-Participation Restrictions Driving;Cleaning;Other   sewing, reading   Stability/Clinical Decision Making Stable/Uncomplicated    Rehab Potential Good    PT Frequency Other (comment)   8 visits over 6 week certification period at 1-2 visits a week   PT Duration 6 weeks    PT Treatment/Interventions ADLs/Self Care Home Management;Aquatic Therapy;Cryotherapy;Electrical Stimulation;Moist Heat;Traction;Balance training;Therapeutic exercise;Therapeutic activities;Functional mobility training;Stair training;Gait training;Neuromuscular re-education;Patient/family education;Manual techniques;Dry needling;Passive range of  motion;Joint Manipulations    PT Next Visit Plan patient to discharge from PT to HEP at this time    PT Home  Exercise Plan supine cervical ROT, self massage to [facility] 11/2 c/sp ret supine, scap ret, scap ret at wall with ER    Consulted and Agree with Plan of Care Patient           Patient will benefit from skilled therapeutic intervention in order to improve the following deficits and impairments:  Pain, Hypomobility, Decreased range of motion, Decreased strength, Postural dysfunction  Visit Diagnosis: Cervicalgia  Abnormal posture     Problem List Patient Active Problem List   Diagnosis Date Noted  . Acute appendicitis with peritoneal abscess 08/24/2015  . Essential hypertension 08/24/2015    11:22 AM, 04/30/20 Jerene Pitch, DPT Physical Therapy with [facility]  [phone] office  [place] [address] [place], [place], [address] Phone: [phone]   Fax:  [phone]  Name: Deborah Stevenson MRN: [id number] Date of Birth: 10/07/1945

## 2020-05-04 ENCOUNTER — Ambulatory Visit (HOSPITAL_COMMUNITY): Payer: Medicare PPO | Admitting: Physical Therapy

## 2020-05-06 ENCOUNTER — Ambulatory Visit (HOSPITAL_COMMUNITY): Payer: Medicare PPO | Admitting: Physical Therapy

## 2020-06-09 DIAGNOSIS — H2512 Age-related nuclear cataract, left eye: Secondary | ICD-10-CM | POA: Diagnosis not present

## 2020-06-12 DIAGNOSIS — H21562 Pupillary abnormality, left eye: Secondary | ICD-10-CM | POA: Diagnosis not present

## 2020-06-12 DIAGNOSIS — H25812 Combined forms of age-related cataract, left eye: Secondary | ICD-10-CM | POA: Diagnosis not present

## 2020-08-11 DIAGNOSIS — I10 Essential (primary) hypertension: Secondary | ICD-10-CM | POA: Diagnosis not present

## 2020-08-11 DIAGNOSIS — D692 Other nonthrombocytopenic purpura: Secondary | ICD-10-CM | POA: Diagnosis not present

## 2020-08-11 DIAGNOSIS — Z79899 Other long term (current) drug therapy: Secondary | ICD-10-CM | POA: Diagnosis not present

## 2020-08-11 DIAGNOSIS — H9190 Unspecified hearing loss, unspecified ear: Secondary | ICD-10-CM | POA: Diagnosis not present

## 2020-08-11 DIAGNOSIS — E7800 Pure hypercholesterolemia, unspecified: Secondary | ICD-10-CM | POA: Diagnosis not present

## 2020-08-11 DIAGNOSIS — Z961 Presence of intraocular lens: Secondary | ICD-10-CM | POA: Diagnosis not present

## 2020-08-11 DIAGNOSIS — M8580 Other specified disorders of bone density and structure, unspecified site: Secondary | ICD-10-CM | POA: Diagnosis not present

## 2020-08-11 DIAGNOSIS — Z23 Encounter for immunization: Secondary | ICD-10-CM | POA: Diagnosis not present

## 2020-08-11 DIAGNOSIS — Z0000 Encounter for general adult medical examination without abnormal findings: Secondary | ICD-10-CM | POA: Diagnosis not present

## 2020-08-11 DIAGNOSIS — M47819 Spondylosis without myelopathy or radiculopathy, site unspecified: Secondary | ICD-10-CM | POA: Diagnosis not present

## 2021-01-21 DIAGNOSIS — H2589 Other age-related cataract: Secondary | ICD-10-CM | POA: Diagnosis not present

## 2021-01-21 DIAGNOSIS — Z961 Presence of intraocular lens: Secondary | ICD-10-CM | POA: Diagnosis not present

## 2021-01-21 DIAGNOSIS — H40011 Open angle with borderline findings, low risk, right eye: Secondary | ICD-10-CM | POA: Diagnosis not present

## 2021-02-19 DIAGNOSIS — S93491A Sprain of other ligament of right ankle, initial encounter: Secondary | ICD-10-CM | POA: Diagnosis not present

## 2021-05-12 DIAGNOSIS — L814 Other melanin hyperpigmentation: Secondary | ICD-10-CM | POA: Diagnosis not present

## 2021-05-12 DIAGNOSIS — C44619 Basal cell carcinoma of skin of left upper limb, including shoulder: Secondary | ICD-10-CM | POA: Diagnosis not present

## 2021-05-12 DIAGNOSIS — D485 Neoplasm of uncertain behavior of skin: Secondary | ICD-10-CM | POA: Diagnosis not present

## 2021-05-12 DIAGNOSIS — L821 Other seborrheic keratosis: Secondary | ICD-10-CM | POA: Diagnosis not present

## 2021-05-12 DIAGNOSIS — L570 Actinic keratosis: Secondary | ICD-10-CM | POA: Diagnosis not present

## 2021-05-12 DIAGNOSIS — D2261 Melanocytic nevi of right upper limb, including shoulder: Secondary | ICD-10-CM | POA: Diagnosis not present

## 2021-05-12 DIAGNOSIS — Z85828 Personal history of other malignant neoplasm of skin: Secondary | ICD-10-CM | POA: Diagnosis not present

## 2021-05-12 DIAGNOSIS — D225 Melanocytic nevi of trunk: Secondary | ICD-10-CM | POA: Diagnosis not present

## 2021-08-04 ENCOUNTER — Other Ambulatory Visit: Payer: Self-pay | Admitting: Family Medicine

## 2021-08-04 DIAGNOSIS — Z1231 Encounter for screening mammogram for malignant neoplasm of breast: Secondary | ICD-10-CM

## 2021-08-12 ENCOUNTER — Ambulatory Visit
Admission: RE | Admit: 2021-08-12 | Discharge: 2021-08-12 | Disposition: A | Discharge status: Home / Self Care | Payer: Medicare PPO | Source: Ambulatory Visit | Attending: Family Medicine | Admitting: Family Medicine

## 2021-08-12 DIAGNOSIS — Z1231 Encounter for screening mammogram for malignant neoplasm of breast: Secondary | ICD-10-CM

## 2021-08-16 DIAGNOSIS — Z85828 Personal history of other malignant neoplasm of skin: Secondary | ICD-10-CM | POA: Diagnosis not present

## 2021-09-08 DIAGNOSIS — M8588 Other specified disorders of bone density and structure, other site: Secondary | ICD-10-CM | POA: Diagnosis not present

## 2021-09-08 DIAGNOSIS — E7800 Pure hypercholesterolemia, unspecified: Secondary | ICD-10-CM | POA: Diagnosis not present

## 2021-09-08 DIAGNOSIS — Z0000 Encounter for general adult medical examination without abnormal findings: Secondary | ICD-10-CM | POA: Diagnosis not present

## 2021-09-08 DIAGNOSIS — M47819 Spondylosis without myelopathy or radiculopathy, site unspecified: Secondary | ICD-10-CM | POA: Diagnosis not present

## 2021-09-08 DIAGNOSIS — H9190 Unspecified hearing loss, unspecified ear: Secondary | ICD-10-CM | POA: Diagnosis not present

## 2021-09-08 DIAGNOSIS — I10 Essential (primary) hypertension: Secondary | ICD-10-CM | POA: Diagnosis not present

## 2021-09-08 DIAGNOSIS — D692 Other nonthrombocytopenic purpura: Secondary | ICD-10-CM | POA: Diagnosis not present

## 2021-09-08 DIAGNOSIS — F330 Major depressive disorder, recurrent, mild: Secondary | ICD-10-CM | POA: Diagnosis not present

## 2021-09-08 DIAGNOSIS — Z79899 Other long term (current) drug therapy: Secondary | ICD-10-CM | POA: Diagnosis not present

## 2021-09-10 ENCOUNTER — Other Ambulatory Visit: Payer: Self-pay | Admitting: Family Medicine

## 2021-09-10 DIAGNOSIS — E2839 Other primary ovarian failure: Secondary | ICD-10-CM

## 2022-01-18 DIAGNOSIS — K529 Noninfective gastroenteritis and colitis, unspecified: Secondary | ICD-10-CM | POA: Diagnosis not present

## 2022-01-19 DIAGNOSIS — C44629 Squamous cell carcinoma of skin of left upper limb, including shoulder: Secondary | ICD-10-CM | POA: Diagnosis not present

## 2022-01-19 DIAGNOSIS — Z85828 Personal history of other malignant neoplasm of skin: Secondary | ICD-10-CM | POA: Diagnosis not present

## 2022-01-26 DIAGNOSIS — H40011 Open angle with borderline findings, low risk, right eye: Secondary | ICD-10-CM | POA: Diagnosis not present

## 2022-01-26 DIAGNOSIS — Z961 Presence of intraocular lens: Secondary | ICD-10-CM | POA: Diagnosis not present

## 2022-01-26 DIAGNOSIS — H2589 Other age-related cataract: Secondary | ICD-10-CM | POA: Diagnosis not present

## 2022-01-26 DIAGNOSIS — K529 Noninfective gastroenteritis and colitis, unspecified: Secondary | ICD-10-CM | POA: Diagnosis not present

## 2022-03-01 DIAGNOSIS — Z23 Encounter for immunization: Secondary | ICD-10-CM | POA: Diagnosis not present

## 2022-03-07 ENCOUNTER — Ambulatory Visit
Admission: RE | Admit: 2022-03-07 | Discharge: 2022-03-07 | Disposition: A | Discharge status: Home / Self Care | Payer: Medicare PPO | Source: Ambulatory Visit | Attending: Family Medicine | Admitting: Family Medicine

## 2022-03-07 DIAGNOSIS — E2839 Other primary ovarian failure: Secondary | ICD-10-CM

## 2022-03-07 DIAGNOSIS — M85852 Other specified disorders of bone density and structure, left thigh: Secondary | ICD-10-CM | POA: Diagnosis not present

## 2022-03-07 DIAGNOSIS — M810 Age-related osteoporosis without current pathological fracture: Secondary | ICD-10-CM | POA: Diagnosis not present

## 2022-03-07 DIAGNOSIS — Z780 Asymptomatic menopausal state: Secondary | ICD-10-CM | POA: Diagnosis not present

## 2022-03-22 ENCOUNTER — Other Ambulatory Visit: Payer: Self-pay | Admitting: Gastroenterology

## 2022-03-22 DIAGNOSIS — K8689 Other specified diseases of pancreas: Secondary | ICD-10-CM

## 2022-04-18 DIAGNOSIS — L821 Other seborrheic keratosis: Secondary | ICD-10-CM | POA: Diagnosis not present

## 2022-04-18 DIAGNOSIS — Z85828 Personal history of other malignant neoplasm of skin: Secondary | ICD-10-CM | POA: Diagnosis not present

## 2022-04-21 ENCOUNTER — Ambulatory Visit
Admission: RE | Admit: 2022-04-21 | Discharge: 2022-04-21 | Disposition: A | Discharge status: Home / Self Care | Payer: Medicare PPO | Source: Ambulatory Visit | Attending: Gastroenterology | Admitting: Gastroenterology

## 2022-04-21 DIAGNOSIS — K8689 Other specified diseases of pancreas: Secondary | ICD-10-CM

## 2022-04-21 DIAGNOSIS — K5641 Fecal impaction: Secondary | ICD-10-CM | POA: Diagnosis not present

## 2022-04-21 MED ORDER — IOPAMIDOL (ISOVUE-370) INJECTION 76%
80.0000 mL | Freq: Once | INTRAVENOUS | Status: AC | PRN
  Administered 2022-04-21: 80 mL via INTRAVENOUS

## 2022-07-11 DIAGNOSIS — D2262 Melanocytic nevi of left upper limb, including shoulder: Secondary | ICD-10-CM | POA: Diagnosis not present

## 2022-07-11 DIAGNOSIS — L570 Actinic keratosis: Secondary | ICD-10-CM | POA: Diagnosis not present

## 2022-07-11 DIAGNOSIS — D485 Neoplasm of uncertain behavior of skin: Secondary | ICD-10-CM | POA: Diagnosis not present

## 2022-07-11 DIAGNOSIS — D2261 Melanocytic nevi of right upper limb, including shoulder: Secondary | ICD-10-CM | POA: Diagnosis not present

## 2022-07-11 DIAGNOSIS — L821 Other seborrheic keratosis: Secondary | ICD-10-CM | POA: Diagnosis not present

## 2022-07-11 DIAGNOSIS — Z85828 Personal history of other malignant neoplasm of skin: Secondary | ICD-10-CM | POA: Diagnosis not present

## 2022-09-20 DIAGNOSIS — M797 Fibromyalgia: Secondary | ICD-10-CM | POA: Diagnosis not present

## 2022-09-20 DIAGNOSIS — M8588 Other specified disorders of bone density and structure, other site: Secondary | ICD-10-CM | POA: Diagnosis not present

## 2022-09-20 DIAGNOSIS — M8580 Other specified disorders of bone density and structure, unspecified site: Secondary | ICD-10-CM | POA: Diagnosis not present

## 2022-09-20 DIAGNOSIS — Z79899 Other long term (current) drug therapy: Secondary | ICD-10-CM | POA: Diagnosis not present

## 2022-09-20 DIAGNOSIS — F330 Major depressive disorder, recurrent, mild: Secondary | ICD-10-CM | POA: Diagnosis not present

## 2022-09-20 DIAGNOSIS — Z0000 Encounter for general adult medical examination without abnormal findings: Secondary | ICD-10-CM | POA: Diagnosis not present

## 2022-09-20 DIAGNOSIS — I10 Essential (primary) hypertension: Secondary | ICD-10-CM | POA: Diagnosis not present

## 2022-09-20 DIAGNOSIS — R7301 Impaired fasting glucose: Secondary | ICD-10-CM | POA: Diagnosis not present

## 2022-09-20 DIAGNOSIS — E7800 Pure hypercholesterolemia, unspecified: Secondary | ICD-10-CM | POA: Diagnosis not present

## 2022-10-12 ENCOUNTER — Other Ambulatory Visit: Payer: Self-pay | Admitting: Family Medicine

## 2022-10-12 DIAGNOSIS — Z0000 Encounter for general adult medical examination without abnormal findings: Secondary | ICD-10-CM

## 2022-10-13 ENCOUNTER — Ambulatory Visit
Admission: RE | Admit: 2022-10-13 | Discharge: 2022-10-13 | Disposition: A | Discharge status: Home / Self Care | Payer: Medicare PPO | Source: Ambulatory Visit | Attending: Family Medicine | Admitting: Family Medicine

## 2022-10-13 DIAGNOSIS — Z1231 Encounter for screening mammogram for malignant neoplasm of breast: Secondary | ICD-10-CM | POA: Diagnosis not present

## 2022-10-13 DIAGNOSIS — Z0000 Encounter for general adult medical examination without abnormal findings: Secondary | ICD-10-CM

## 2022-11-21 DIAGNOSIS — K8681 Exocrine pancreatic insufficiency: Secondary | ICD-10-CM | POA: Diagnosis not present

## 2022-11-21 DIAGNOSIS — Z86010 Personal history of colonic polyps: Secondary | ICD-10-CM | POA: Diagnosis not present

## 2023-01-30 DIAGNOSIS — H2589 Other age-related cataract: Secondary | ICD-10-CM | POA: Diagnosis not present

## 2023-01-30 DIAGNOSIS — Z961 Presence of intraocular lens: Secondary | ICD-10-CM | POA: Diagnosis not present

## 2023-01-30 DIAGNOSIS — H40011 Open angle with borderline findings, low risk, right eye: Secondary | ICD-10-CM | POA: Diagnosis not present

## 2023-08-21 DIAGNOSIS — S61411A Laceration without foreign body of right hand, initial encounter: Secondary | ICD-10-CM | POA: Diagnosis not present

## 2023-09-25 DIAGNOSIS — D2261 Melanocytic nevi of right upper limb, including shoulder: Secondary | ICD-10-CM | POA: Diagnosis not present

## 2023-09-25 DIAGNOSIS — D692 Other nonthrombocytopenic purpura: Secondary | ICD-10-CM | POA: Diagnosis not present

## 2023-09-25 DIAGNOSIS — L738 Other specified follicular disorders: Secondary | ICD-10-CM | POA: Diagnosis not present

## 2023-09-25 DIAGNOSIS — L821 Other seborrheic keratosis: Secondary | ICD-10-CM | POA: Diagnosis not present

## 2023-09-25 DIAGNOSIS — Z85828 Personal history of other malignant neoplasm of skin: Secondary | ICD-10-CM | POA: Diagnosis not present

## 2023-09-25 DIAGNOSIS — L905 Scar conditions and fibrosis of skin: Secondary | ICD-10-CM | POA: Diagnosis not present

## 2023-10-04 DIAGNOSIS — M8580 Other specified disorders of bone density and structure, unspecified site: Secondary | ICD-10-CM | POA: Diagnosis not present

## 2023-10-04 DIAGNOSIS — F331 Major depressive disorder, recurrent, moderate: Secondary | ICD-10-CM | POA: Diagnosis not present

## 2023-10-04 DIAGNOSIS — R7301 Impaired fasting glucose: Secondary | ICD-10-CM | POA: Diagnosis not present

## 2023-10-04 DIAGNOSIS — Z0000 Encounter for general adult medical examination without abnormal findings: Secondary | ICD-10-CM | POA: Diagnosis not present

## 2023-10-04 DIAGNOSIS — M8588 Other specified disorders of bone density and structure, other site: Secondary | ICD-10-CM | POA: Diagnosis not present

## 2023-10-04 DIAGNOSIS — Z79899 Other long term (current) drug therapy: Secondary | ICD-10-CM | POA: Diagnosis not present

## 2023-10-04 DIAGNOSIS — I10 Essential (primary) hypertension: Secondary | ICD-10-CM | POA: Diagnosis not present

## 2023-10-04 DIAGNOSIS — E7800 Pure hypercholesterolemia, unspecified: Secondary | ICD-10-CM | POA: Diagnosis not present

## 2024-02-01 ENCOUNTER — Other Ambulatory Visit: Payer: Self-pay | Admitting: Family Medicine

## 2024-02-01 DIAGNOSIS — Z0000 Encounter for general adult medical examination without abnormal findings: Secondary | ICD-10-CM

## 2024-02-06 DIAGNOSIS — H40011 Open angle with borderline findings, low risk, right eye: Secondary | ICD-10-CM | POA: Diagnosis not present

## 2024-02-06 DIAGNOSIS — H353131 Nonexudative age-related macular degeneration, bilateral, early dry stage: Secondary | ICD-10-CM | POA: Diagnosis not present

## 2024-02-09 ENCOUNTER — Other Ambulatory Visit: Payer: Self-pay

## 2024-02-09 ENCOUNTER — Encounter (HOSPITAL_COMMUNITY): Payer: Self-pay

## 2024-02-09 ENCOUNTER — Ambulatory Visit (HOSPITAL_COMMUNITY)

## 2024-02-09 DIAGNOSIS — M25552 Pain in left hip: Secondary | ICD-10-CM | POA: Diagnosis not present

## 2024-02-09 DIAGNOSIS — Z7409 Other reduced mobility: Secondary | ICD-10-CM | POA: Insufficient documentation

## 2024-02-09 NOTE — Therapy (Signed)
 OUTPATIENT PHYSICAL THERAPY LOWER EXTREMITY EVALUATION   Patient Name: Deborah Stevenson MRN: [id number] DOB:January 27, 1946, 78 y.o., female Today's Date: 02/09/2024  END OF SESSION:  PT End of Session - 02/09/24 1625     Visit Number 1    Date for PT Re-Evaluation 03/22/24    Authorization Type [employer] CHO[employer] PPO    Authorization Time Period seeking auth    Progress Note Due on Visit 10    PT Start Time 1515    PT Stop Time 1600    PT Time Calculation (min) 45 min    Activity Tolerance Patient tolerated treatment well;Patient limited by pain    Behavior During Therapy [facility] for tasks assessed/performed          Past Medical History:  Diagnosis Date   Depression    Hypertension    Past Surgical History:  Procedure Laterality Date   BREAST EXCISIONAL BIOPSY Left    CESAREAN SECTION     Patient Active Problem List   Diagnosis Date Noted   Acute appendicitis with peritoneal abscess 08/24/2015   Essential hypertension 08/24/2015    PCP: Verena Mems, MD   REFERRING PROVIDER: Reyne Cordella SQUIBB, MD  REFERRING DIAG: lt hip pain  THERAPY DIAG:  Pain in left hip  Impaired functional mobility, balance, and endurance  Rationale for Evaluation and Treatment: Rehabilitation  ONSET DATE: over 6 months  SUBJECTIVE:   SUBJECTIVE STATEMENT: Pt states she has had therapy before for low back pain, therapy helped. Pt states she has bumped the left hip but can not say what started the left hip pain, it comes and goes, was a 7/10 this last weekend. Pt states she is fine most mornings but by the afternoon the left hip is hurting. Pt states she was able to walk 2 miles everyday in the Spring earlier this year and has not been able to do that due to increased pain. Pt states she has MRI scheduled for left hip soon. Pt reports she does low back exercises, some at least everyday.  PERTINENT HISTORY: Osteoporosis PAIN:  Are you having pain? Yes: NPRS scale: 7/10 Pain  location: left lateral hip and bilateral quad Pain description: achey, soreness Aggravating factors: laying on left side, walking a long time, sitting a long time, stairs Relieving factors: ice, tylenol , rest  PRECAUTIONS: None  RED FLAGS: None   WEIGHT BEARING RESTRICTIONS: No  FALLS:  Has patient fallen in last 6 months? No  LIVING ENVIRONMENT: Lives with: lives with their spouse Lives in: House/apartment Stairs: Yes: External: 4 steps; on right going up Has following equipment at home: pt states she uses a walking sticks for long walks  OCCUPATION: school teacher  PLOF: Independent  PATIENT GOALS: decrease pain in left, increase walking tolerance, strengthen LEs  NEXT MD VISIT: MRI next Thursday  OBJECTIVE:  Note: Objective measures were completed at Evaluation unless otherwise noted.  DIAGNOSTIC FINDINGS: None  PATIENT SURVEYS:  LEFS : 37 / 80 = 46.3 %   COGNITION: Overall cognitive status: Within functional limits for tasks assessed     SENSATION: WFL  EDEMA:  None reported   PALPATION: Tenderness to palpation to anterior lateral thigh more on left side. Most tenderness noted on left greater trochanter  LOWER EXTREMITY ROM:  Passive ROM Right eval Left eval  Hip flexion [facility] WFL, pain with OP  Hip extension    Hip abduction    Hip adduction    Hip internal rotation    Hip external rotation  Knee flexion    Knee extension    Ankle dorsiflexion    Ankle plantarflexion    Ankle inversion    Ankle eversion     (Blank rows = not tested)  LOWER EXTREMITY MMT:  MMT Right eval Left eval  Hip flexion 4 3+, pain  Hip extension    Hip abduction 4 3+, pain  Hip adduction 4 3+  Hip internal rotation    Hip external rotation    Knee flexion    Knee extension    Ankle dorsiflexion    Ankle plantarflexion    Ankle inversion    Ankle eversion     (Blank rows = not tested)  LOWER EXTREMITY SPECIAL TESTS:  Hip special tests: Belvie (FABER)  test: positive   FUNCTIONAL TESTS:  5 times sit to stand: 15.5 seconds, no increased pain 2 minute walk test: 591 feet, no increased hip pain reported SLS 02/09/24/25: R: 11.49 sec L: 24.02 sec  GAIT: Distance walked: 650 feet Assistive device utilized: None Level of assistance: Complete Independence Comments: Pt demonstrates minimal compensations in gait cycle during with no increase in hip pain reported, pt states it takes about a mile for pain to come on. Pt demonstrates slight toe whip of  RLE and flat foot more pronounced on the R foot as well.                                                                                                                                 TREATMENT DATE:  02/09/2024  Evaluation: -ROM measured, Strength assessed, HEP prescribed, pt educated on prognosis, findings, and importance of HEP compliance if given.      PATIENT EDUCATION:  Education details: Pt was educated on findings of PT evaluation, prognosis, frequency of therapy visits and rationale, attendance policy, and HEP if given.   Person educated: Patient Education method: Explanation and Verbal cues Education comprehension: verbalized understanding, verbal cues required, and tactile cues required  HOME EXERCISE PROGRAM: Access Code: [email or web address] URL: [email or web address] Date: 02/09/2024 Prepared by: Lang Ada  Exercises - Supine Bridge  - 1 x daily - 7 x weekly - 3 sets - 10 reps - 5 hold - Standing Single Leg Stance with Counter Support  - 1 x daily - 7 x weekly - 1 sets - 3 reps - 30 hold - Sit to Stand with Arms Crossed  - 1 x daily - 7 x weekly - 3 sets - 10 reps  ASSESSMENT:  CLINICAL IMPRESSION: Patient is a 78 y.o. female who was seen today for physical therapy evaluation and treatment for lt hip pain.  Patient demonstrates decreased LE strength, abnormal pain rating, and impaired balance. Patient also demonstrates good performance with ambulation during  today's session with good pace observed during with no reproduction of symptoms. Patient does demonstrate reproduction of symptoms during strength testing of hip flexion and abduction as well as tenderness to palpation of greater  trochanter/bursae, iliopsoas busra and posterior gluteal musculature. Patient requires education on prognosis of likely contributing factors to left hip pain with OP with flexion ROM and FABER test as well as possible anterior and lateral hip bursitis and tendonitis of hip flexors and hip abductors. Patient would benefit from skilled physical therapy for decreased left hip pain, increased endurance with ambulation, increased LE strength, and balance for improved gait quality, return to higher level of function with ADLs, and progress towards therapy goals.    OBJECTIVE IMPAIRMENTS: decreased activity tolerance, decreased balance, decreased endurance, decreased knowledge of use of DME, decreased mobility, difficulty walking, decreased ROM, decreased strength, and pain.   ACTIVITY LIMITATIONS: carrying, lifting, bending, sitting, standing, squatting, sleeping, stairs, transfers, and bed mobility  PARTICIPATION LIMITATIONS: meal prep, laundry, shopping, community activity, and yard work  PERSONAL FACTORS: Age, Fitness, Past/current experiences, Time since onset of injury/illness/exacerbation, and 1 comorbidity: hx of low back pain are also affecting patient's functional outcome.   REHAB POTENTIAL: Fair chronic in nature  CLINICAL DECISION MAKING: Stable/uncomplicated  EVALUATION COMPLEXITY: Low   GOALS: Goals reviewed with patient? No  SHORT TERM GOALS: Target date: 03/01/24  Patient will demonstrate evidence of independence with individualized HEP and will report compliance for at least 3 days per week for optimized progression towards remaining therapy goals. Baseline:  Goal status: INITIAL  2.  Patient will report a decrease in pain level during community  ambulation by at least 2 points for improved quality of life. Baseline: 7/10 Goal status: INITIAL     LONG TERM GOALS: Target date: 03/22/24  Pt will demonstrate a an increase of at least 9 points on the LEFS for improved performance of community ambulation and ADL. Baseline: see objective Goal status: INITIAL  2.  Pt will improve 2 MWT by 40 feet in order to demonstrate improved functional ambulatory capacity in community setting.  Baseline: see objective Goal status: INITIAL  3.  Pt will demonstrate WFL pain free ROM (flexion and extension) in left hip, for increased mobility and maximal efficiency of gait cycle during ambulation. Baseline: see objective Goal status: INITIAL  4.  Pt will demonstrate at least 4/5 MMT for right lower extremity for increased strength during ADL and community ambulation. Baseline: see objective Goal status: INITIAL  5.  Pt will improve 5TSTS by at least 2.3 seconds in order to improve strength during functional activities. Baseline: see objective Goal status: INITIAL    PLAN:  PT FREQUENCY: 1-2x/week  PT DURATION: 6 weeks  PLANNED INTERVENTIONS: 97110-Therapeutic exercises, 97530- Therapeutic activity, 97112- Neuromuscular re-education, 97535- Self Care, [id number]- Manual therapy, [phone]- Gait training, Patient/Family education, Balance training, Stair training, Joint mobilization, DME instructions, Cryotherapy, and Moist heat  PLAN FOR NEXT SESSION: Obtain AROM of left and Right hip, assess strength of bilateral hip internal/external rotation and extension,  progress hip and proximal LE strengthening, incorporate balance   Lang Ada, PT, DPT [facility] Office: [phone] 4:29 PM, 02/09/24  Mylene Barrows Request Treatment Start Date: 02/10/24  Referring diagnosis code (ICD 10)? lt hip pain Treatment diagnosis codes (ICD 10)? (if different than referring diagnosis) M25.552; Z74.09 What was this (referring dx)  caused by? []  Surgery []  Fall [x]  Ongoing issue []  Arthritis []  Other: ____________  Laterality: []  Rt [x]  Lt []  Both  Deficits: [x]  Pain []  Stiffness [x]  Weakness []  Edema [x]  Balance Deficits []  Coordination []  Gait Disturbance []  ROM []  Other   Functional Tool Score: LEFS: 37/80  CPT codes: See Planned Interventions  listed in the Plan section of the Evaluation.

## 2024-02-14 ENCOUNTER — Encounter (HOSPITAL_COMMUNITY)

## 2024-02-16 ENCOUNTER — Encounter (HOSPITAL_COMMUNITY)

## 2024-02-20 ENCOUNTER — Ambulatory Visit
Admission: RE | Admit: 2024-02-20 | Discharge: 2024-02-20 | Disposition: A | Discharge status: Home / Self Care | Source: Ambulatory Visit | Attending: Family Medicine | Admitting: Family Medicine

## 2024-02-20 DIAGNOSIS — Z0000 Encounter for general adult medical examination without abnormal findings: Secondary | ICD-10-CM

## 2024-02-27 ENCOUNTER — Encounter (HOSPITAL_COMMUNITY): Payer: Self-pay

## 2024-02-27 ENCOUNTER — Ambulatory Visit (HOSPITAL_COMMUNITY)

## 2024-02-27 DIAGNOSIS — M25552 Pain in left hip: Secondary | ICD-10-CM | POA: Insufficient documentation

## 2024-02-27 DIAGNOSIS — Z7409 Other reduced mobility: Secondary | ICD-10-CM | POA: Insufficient documentation

## 2024-02-27 NOTE — Therapy (Signed)
 OUTPATIENT PHYSICAL THERAPY LOWER EXTREMITY EVALUATION   Patient Name: Deborah Stevenson MRN: [id number] DOB:01/25/1946, 78 y.o., female Today's Date: 02/27/2024  END OF SESSION:  PT End of Session - 02/27/24 1110     Visit Number 2    Date for PT Re-Evaluation 03/22/24    Authorization Type [employer] MEDIC[employer] CHOICE PPO    Authorization Time Period seeking auth    Progress Note Due on Visit 10    PT Start Time 1110    PT Stop Time 1136    PT Time Calculation (min) 26 min    Activity Tolerance Patient tolerated treatment well;Patient limited by pain    Behavior During Therapy [facility] for tasks assessed/performed           Past Medical History:  Diagnosis Date   Depression    Hypertension    Past Surgical History:  Procedure Laterality Date   BREAST EXCISIONAL BIOPSY Left    CESAREAN SECTION     Patient Active Problem List   Diagnosis Date Noted   Acute appendicitis with peritoneal abscess 08/24/2015   Essential hypertension 08/24/2015    PCP: Verena Mems, MD   REFERRING PROVIDER: Reyne Cordella SQUIBB, MD  REFERRING DIAG: lt hip pain  THERAPY DIAG:  Pain in left hip  Impaired functional mobility, balance, and endurance  Rationale for Evaluation and Treatment: Rehabilitation  ONSET DATE: over 6 months  SUBJECTIVE:   SUBJECTIVE STATEMENT: Pt states that the ceasing of single knee to chest has helped, pain only at 2/10 this morning. Reports HEP compliance.  Eval: Pt states she has had therapy before for low back pain, therapy helped. Pt states she has bumped the left hip but can not say what started the left hip pain, it comes and goes, was a 7/10 this last weekend. Pt states she is fine most mornings but by the afternoon the left hip is hurting. Pt states she was able to walk 2 miles everyday in the Spring earlier this year and has not been able to do that due to increased pain. Pt states she has MRI scheduled for left hip soon. Pt reports she does low back  exercises, some at least everyday.  PERTINENT HISTORY: Osteoporosis PAIN:  Are you having pain? Yes: NPRS scale: 7/10 Pain location: left lateral hip and bilateral quad Pain description: achey, soreness Aggravating factors: laying on left side, walking a long time, sitting a long time, stairs Relieving factors: ice, tylenol , rest  PRECAUTIONS: None  RED FLAGS: None   WEIGHT BEARING RESTRICTIONS: No  FALLS:  Has patient fallen in last 6 months? No  LIVING ENVIRONMENT: Lives with: lives with their spouse Lives in: House/apartment Stairs: Yes: External: 4 steps; on right going up Has following equipment at home: pt states she uses a walking sticks for long walks  OCCUPATION: school teacher  PLOF: Independent  PATIENT GOALS: decrease pain in left, increase walking tolerance, strengthen LEs  NEXT MD VISIT: MRI next Thursday  OBJECTIVE:  Note: Objective measures were completed at Evaluation unless otherwise noted.  DIAGNOSTIC FINDINGS: None  PATIENT SURVEYS:  LEFS : 37 / 80 = 46.3 %   COGNITION: Overall cognitive status: Within functional limits for tasks assessed     SENSATION: WFL  EDEMA:  None reported   PALPATION: Tenderness to palpation to anterior lateral thigh more on left side. Most tenderness noted on left greater trochanter  LOWER EXTREMITY ROM:  Active ROM Right eval Left eval  Hip flexion [facility] WFL, pain with OP  Hip extension    Hip abduction    Hip adduction    Hip internal rotation [facility]  Hip external rotation 43 35, pain  Knee flexion    Knee extension    Ankle dorsiflexion    Ankle plantarflexion    Ankle inversion    Ankle eversion     (Blank rows = not tested)  LOWER EXTREMITY MMT:  MMT Right eval Left eval  Hip flexion 4 3+, pain  Hip extension    Hip abduction 4 3+, pain  Hip adduction 4 3+  Hip internal rotation    Hip external rotation    Knee flexion    Knee extension    Ankle dorsiflexion    Ankle  plantarflexion    Ankle inversion    Ankle eversion     (Blank rows = not tested)  LOWER EXTREMITY SPECIAL TESTS:  Hip special tests: Belvie (FABER) test: positive   FUNCTIONAL TESTS:  5 times sit to stand: 15.5 seconds, no increased pain 2 minute walk test: 591 feet, no increased hip pain reported SLS 02/09/24/25: R: 11.49 sec L: 24.02 sec  GAIT: Distance walked: 650 feet Assistive device utilized: None Level of assistance: Complete Independence Comments: Pt demonstrates minimal compensations in gait cycle during with no increase in hip pain reported, pt states it takes about a mile for pain to come on. Pt demonstrates slight toe whip of  RLE and flat foot more pronounced on the R foot as well.                                                                                                                                 TREATMENT DATE:  02/27/2024  Therapeutic Exercise: -Supine bridges 1 sets of 10 reps, 5 second holds, GTB at knees, pt cued for max hip extension -Standing 3 way hip 1 sets 8 reps, bilaterally, pt cued for upright trunk and maintaining of neutral spine, GTB at knees -Lateral stepping 2 laps 10 steps per lap, with GTB around knees, pt cued for upright posture and core activation -Forward lunges on bosu ball, 1 set of 5 reps better performance going into RLE, pt cued for core activation and upright posture -Hip hike, 1 sets of 10 reps, on 2 inch step, pt cued for hip activation Therapeutic Activity: -Stair navigation, 2 bouts, 4 stairs, pt cued for minimal UE support and leveling sensation of hips  02/09/2024  Evaluation: -ROM measured, Strength assessed, HEP prescribed, pt educated on prognosis, findings, and importance of HEP compliance if given.      PATIENT EDUCATION:  Education details: Pt was educated on findings of PT evaluation, prognosis, frequency of therapy visits and rationale, attendance policy, and HEP if given.   Person educated:  Patient Education method: Explanation and Verbal cues Education comprehension: verbalized understanding, verbal cues required, and tactile cues required  HOME EXERCISE PROGRAM: Access Code: [email or web address] URL: [email or web address] Date: 02/09/2024 Prepared by: Bayler Gehrig  Brynna Dobos  Exercises - Supine Bridge  - 1 x daily - 7 x weekly - 3 sets - 10 reps - 5 hold - Standing Single Leg Stance with Counter Support  - 1 x daily - 7 x weekly - 1 sets - 3 reps - 30 hold - Sit to Stand with Arms Crossed  - 1 x daily - 7 x weekly - 3 sets - 10 reps  ASSESSMENT:  CLINICAL IMPRESSION: Patient continues to demonstrate increased left hip pain, decreased LE strength, decreased gait quality and SLS tolerance. Patient also demonstrates good motivation with willingness to attempt even more advanced movements during today's session. Patient able to progress dynamic balance and core activation exercises today with hip hike and banded walks, good performance with verbal cueing. Patient would continue to benefit from skilled physical therapy for  decreased left hip pain, increased endurance with ambulation, increased LE strength, and improved balance for improved quality of life, improved independence with management of left hip pain and continued progress towards therapy goals.   Eval: Patient is a 78 y.o. female who was seen today for physical therapy evaluation and treatment for lt hip pain. Patient demonstrates decreased LE strength, abnormal pain rating, and impaired balance. Patient also demonstrates good performance with ambulation during today's session with good pace observed during with no reproduction of symptoms. Patient does demonstrate reproduction of symptoms during strength testing of hip flexion and abduction as well as tenderness to palpation of greater trochanter/bursae, iliopsoas busra and posterior gluteal musculature. Patient requires education on prognosis of likely contributing factors to  left hip pain with OP with flexion ROM and FABER test as well as possible anterior and lateral hip bursitis and tendonitis of hip flexors and hip abductors. Patient would benefit from skilled physical therapy for decreased left hip pain, increased endurance with ambulation, increased LE strength, and balance for improved gait quality, return to higher level of function with ADLs, and progress towards therapy goals.    OBJECTIVE IMPAIRMENTS: decreased activity tolerance, decreased balance, decreased endurance, decreased knowledge of use of DME, decreased mobility, difficulty walking, decreased ROM, decreased strength, and pain.   ACTIVITY LIMITATIONS: carrying, lifting, bending, sitting, standing, squatting, sleeping, stairs, transfers, and bed mobility  PARTICIPATION LIMITATIONS: meal prep, laundry, shopping, community activity, and yard work  PERSONAL FACTORS: Age, Fitness, Past/current experiences, Time since onset of injury/illness/exacerbation, and 1 comorbidity: hx of low back pain are also affecting patient's functional outcome.   REHAB POTENTIAL: Fair chronic in nature  CLINICAL DECISION MAKING: Stable/uncomplicated  EVALUATION COMPLEXITY: Low   GOALS: Goals reviewed with patient? No  SHORT TERM GOALS: Target date: 03/01/24  Patient will demonstrate evidence of independence with individualized HEP and will report compliance for at least 3 days per week for optimized progression towards remaining therapy goals. Baseline:  Goal status: INITIAL  2.  Patient will report a decrease in pain level during community ambulation by at least 2 points for improved quality of life. Baseline: 7/10 Goal status: INITIAL     LONG TERM GOALS: Target date: 03/22/24  Pt will demonstrate a an increase of at least 9 points on the LEFS for improved performance of community ambulation and ADL. Baseline: see objective Goal status: INITIAL  2.  Pt will improve 2 MWT by 40 feet in order to  demonstrate improved functional ambulatory capacity in community setting.  Baseline: see objective Goal status: INITIAL  3.  Pt will demonstrate WFL pain free ROM (flexion and extension) in left hip, for increased  mobility and maximal efficiency of gait cycle during ambulation. Baseline: see objective Goal status: INITIAL  4.  Pt will demonstrate at least 4/5 MMT for right lower extremity for increased strength during ADL and community ambulation. Baseline: see objective Goal status: INITIAL  5.  Pt will improve 5TSTS by at least 2.3 seconds in order to improve strength during functional activities. Baseline: see objective Goal status: INITIAL    PLAN:  PT FREQUENCY: 1-2x/week  PT DURATION: 6 weeks  PLANNED INTERVENTIONS: 97110-Therapeutic exercises, 97530- Therapeutic activity, 97112- Neuromuscular re-education, 97535- Self Care, 02859- Manual therapy, [phone]- Gait training, Patient/Family education, Balance training, Stair training, Joint mobilization, DME instructions, Cryotherapy, and Moist heat  PLAN FOR NEXT SESSION: Formally assess strength of bilateral hip internal/external rotation and extension,  progress hip and proximal LE strengthening, incorporate balance   Lang Ada, PT, DPT [facility] Office: [phone] 11:42 AM, 02/27/24

## 2024-03-05 ENCOUNTER — Encounter (HOSPITAL_COMMUNITY)

## 2024-03-05 ENCOUNTER — Encounter (HOSPITAL_COMMUNITY): Payer: Self-pay

## 2024-03-06 ENCOUNTER — Encounter (HOSPITAL_COMMUNITY): Admitting: Physical Therapy

## 2024-03-07 ENCOUNTER — Ambulatory Visit (HOSPITAL_COMMUNITY)

## 2024-03-07 ENCOUNTER — Encounter (HOSPITAL_COMMUNITY): Payer: Self-pay

## 2024-03-07 DIAGNOSIS — M25552 Pain in left hip: Secondary | ICD-10-CM | POA: Diagnosis not present

## 2024-03-07 DIAGNOSIS — Z7409 Other reduced mobility: Secondary | ICD-10-CM

## 2024-03-07 NOTE — Therapy (Signed)
 OUTPATIENT PHYSICAL THERAPY LOWER EXTREMITY EVALUATION   Patient Name: Deborah Stevenson MRN: [id number] DOB:[birth year], 78 y.o., female Today's Date: 03/07/2024  END OF SESSION:  PT End of Session - 03/07/24 1550     Visit Number 3    Number of Visits 12    Date for Recertification  03/22/24    Authorization Type [employer] MEDIC[employer] CHOICE PPO    Authorization Time Period cohere approved 10 visits from 02/09/24-05/09/2024    Authorization - Visit Number 2    Authorization - Number of Visits 10    Progress Note Due on Visit 10    PT Start Time 1550    PT Stop Time 1630    PT Time Calculation (min) 40 min    Activity Tolerance Patient tolerated treatment well    Behavior During Therapy [facility] for tasks assessed/performed           Past Medical History:  Diagnosis Date   Depression    Hypertension    Past Surgical History:  Procedure Laterality Date   BREAST EXCISIONAL BIOPSY Left    CESAREAN SECTION     Patient Active Problem List   Diagnosis Date Noted   Acute appendicitis with peritoneal abscess 08/24/2015   Essential hypertension 08/24/2015    PCP: Verena Mems, MD   REFERRING PROVIDER: Reyne Cordella SQUIBB, MD  REFERRING DIAG: lt hip pain  THERAPY DIAG:  Pain in left hip  Impaired functional mobility, balance, and endurance  Rationale for Evaluation and Treatment: Rehabilitation  ONSET DATE: over 6 months  SUBJECTIVE:   SUBJECTIVE STATEMENT: 03/07/24:No pain currently, does have some pain following standing for 3 hours or longer or walking long duration.  Reports she has been compliant.  Most difficulty going up stairs and difficulty with balance.    Eval: Pt states she has had therapy before for low back pain, therapy helped. Pt states she has bumped the left hip but can not say what started the left hip pain, it comes and goes, was a 7/10 this last weekend. Pt states she is fine most mornings but by the afternoon the left hip is hurting. Pt states she  was able to walk 2 miles everyday in the Spring earlier this year and has not been able to do that due to increased pain. Pt states she has MRI scheduled for left hip soon. Pt reports she does low back exercises, some at least everyday.  PERTINENT HISTORY: Osteoporosis PAIN:  Are you having pain? Yes: NPRS scale: 7/10 Pain location: left lateral hip and bilateral quad Pain description: achey, soreness Aggravating factors: laying on left side, walking a long time, sitting a long time, stairs Relieving factors: ice, tylenol , rest  PRECAUTIONS: None  RED FLAGS: None   WEIGHT BEARING RESTRICTIONS: No  FALLS:  Has patient fallen in last 6 months? No  LIVING ENVIRONMENT: Lives with: lives with their spouse Lives in: House/apartment Stairs: Yes: External: 4 steps; on right going up Has following equipment at home: pt states she uses a walking sticks for long walks  OCCUPATION: school teacher  PLOF: Independent  PATIENT GOALS: decrease pain in left, increase walking tolerance, strengthen LEs  NEXT MD VISIT: MRI next Thursday  OBJECTIVE:  Note: Objective measures were completed at Evaluation unless otherwise noted.  DIAGNOSTIC FINDINGS: None  PATIENT SURVEYS:  LEFS : 37 / 80 = 46.3 %   COGNITION: Overall cognitive status: Within functional limits for tasks assessed     SENSATION: WFL  EDEMA:  None reported  PALPATION: Tenderness to palpation to anterior lateral thigh more on left side. Most tenderness noted on left greater trochanter  LOWER EXTREMITY ROM:  Active ROM Right eval Left eval  Hip flexion [facility] WFL, pain with OP  Hip extension    Hip abduction    Hip adduction    Hip internal rotation [facility]  Hip external rotation 43 35, pain  Knee flexion    Knee extension    Ankle dorsiflexion    Ankle plantarflexion    Ankle inversion    Ankle eversion     (Blank rows = not tested)  LOWER EXTREMITY MMT:  MMT Right eval Left eval Right 03/07/24  Left 03/07/24  Hip flexion 4 3+, pain    Hip extension   4- 4-  Hip abduction 4 3+, pain    Hip adduction 4 3+    Hip internal rotation   4-/5 3+  Hip external rotation   3+ 3/5 *  Knee flexion      Knee extension      Ankle dorsiflexion      Ankle plantarflexion      Ankle inversion      Ankle eversion       (Blank rows = not tested)  *=pain  LOWER EXTREMITY SPECIAL TESTS:  Hip special tests: Belvie (FABER) test: positive   FUNCTIONAL TESTS:  5 times sit to stand: 15.5 seconds, no increased pain 2 minute walk test: 591 feet, no increased hip pain reported SLS 02/09/24/25: R: 11.49 sec L: 24.02 sec  GAIT: Distance walked: 650 feet Assistive device utilized: None Level of assistance: Complete Independence Comments: Pt demonstrates minimal compensations in gait cycle during with no increase in hip pain reported, pt states it takes about a mile for pain to come on. Pt demonstrates slight toe whip of  RLE and flat foot more pronounced on the R foot as well.                                                                                                                                 TREATMENT DATE:  03/07/24: MMT for IR/ER and hip extension see above Sidelying:  -Clam with RTB 10x 5 STS with RTB around thighs to reduce valgus 10x 5 Standing: - Sidestep with GTB around thighs 2RT - Toe tapping 6in step 20x  - Lateral toe tapping between 6in and 8in step 10x each side - Vector stance 3x 5 holds - Tandem stance 1x 30 on solid floor then 2x 30 on foam   02/27/2024  Therapeutic Exercise: -Supine bridges 1 sets of 10 reps, 5 second holds, GTB at knees, pt cued for max hip extension -Standing 3 way hip 1 sets 8 reps, bilaterally, pt cued for upright trunk and maintaining of neutral spine, GTB at knees -Lateral stepping 2 laps 10 steps per lap, with GTB around knees, pt cued for upright posture and core activation -Forward lunges on bosu  ball, 1 set of 5 reps better  performance going into RLE, pt cued for core activation and upright posture -Hip hike, 1 sets of 10 reps, on 2 inch step, pt cued for hip activation Therapeutic Activity: -Stair navigation, 2 bouts, 4 stairs, pt cued for minimal UE support and leveling sensation of hips  02/09/2024  Evaluation: -ROM measured, Strength assessed, HEP prescribed, pt educated on prognosis, findings, and importance of HEP compliance if given.      PATIENT EDUCATION:  Education details: Pt was educated on findings of PT evaluation, prognosis, frequency of therapy visits and rationale, attendance policy, and HEP if given.   Person educated: Patient Education method: Explanation and Verbal cues Education comprehension: verbalized understanding, verbal cues required, and tactile cues required  HOME EXERCISE PROGRAM: Access Code: [email or web address] URL: [email or web address] Date: 02/09/2024 Prepared by: Lang Ada  Exercises - Supine Bridge  - 1 x daily - 7 x weekly - 3 sets - 10 reps - 5 hold - Standing Single Leg Stance with Counter Support  - 1 x daily - 7 x weekly - 1 sets - 3 reps - 30 hold - Sit to Stand with Arms Crossed  - 1 x daily - 7 x weekly - 3 sets - 10 reps  03/07/24:  - Side Stepping with Resistance at Thighs and Counter Support  - 2 x daily - 7 x weekly - 3 sets - 10 reps - Clam with Resistance  - 1 x daily - 7 x weekly - 2 sets - 10 reps - 5 hold  ASSESSMENT:  CLINICAL IMPRESSION: Began session with MMT to assess gluteal strength with noted weakness for all musculature tested and reports of pain with hip flexion and ER.  Added clam to HEP for gluteal strengthening.  Progressed to standing LE strengthening and balance training on static and dynamic surfaces.  Pt tolerated well to session with reports only during hip flexion exercises with increased intermittent pain scale 2/10.     Eval: Patient is a 78 y.o. female who was seen today for physical therapy evaluation and treatment  for lt hip pain. Patient demonstrates decreased LE strength, abnormal pain rating, and impaired balance. Patient also demonstrates good performance with ambulation during today's session with good pace observed during with no reproduction of symptoms. Patient does demonstrate reproduction of symptoms during strength testing of hip flexion and abduction as well as tenderness to palpation of greater trochanter/bursae, iliopsoas busra and posterior gluteal musculature. Patient requires education on prognosis of likely contributing factors to left hip pain with OP with flexion ROM and FABER test as well as possible anterior and lateral hip bursitis and tendonitis of hip flexors and hip abductors. Patient would benefit from skilled physical therapy for decreased left hip pain, increased endurance with ambulation, increased LE strength, and balance for improved gait quality, return to higher level of function with ADLs, and progress towards therapy goals.    OBJECTIVE IMPAIRMENTS: decreased activity tolerance, decreased balance, decreased endurance, decreased knowledge of use of DME, decreased mobility, difficulty walking, decreased ROM, decreased strength, and pain.   ACTIVITY LIMITATIONS: carrying, lifting, bending, sitting, standing, squatting, sleeping, stairs, transfers, and bed mobility  PARTICIPATION LIMITATIONS: meal prep, laundry, shopping, community activity, and yard work  PERSONAL FACTORS: Age, Fitness, Past/current experiences, Time since onset of injury/illness/exacerbation, and 1 comorbidity: hx of low back pain are also affecting patient's functional outcome.   REHAB POTENTIAL: Fair chronic in nature  CLINICAL DECISION MAKING: Stable/uncomplicated  EVALUATION COMPLEXITY: Low  GOALS: Goals reviewed with patient? No  SHORT TERM GOALS: Target date: 03/01/24  Patient will demonstrate evidence of independence with individualized HEP and will report compliance for at least 3 days per  week for optimized progression towards remaining therapy goals. Baseline:  Goal status: INITIAL  2.  Patient will report a decrease in pain level during community ambulation by at least 2 points for improved quality of life. Baseline: 7/10 Goal status: INITIAL     LONG TERM GOALS: Target date: 03/22/24  Pt will demonstrate a an increase of at least 9 points on the LEFS for improved performance of community ambulation and ADL. Baseline: see objective Goal status: INITIAL  2.  Pt will improve 2 MWT by 40 feet in order to demonstrate improved functional ambulatory capacity in community setting.  Baseline: see objective Goal status: INITIAL  3.  Pt will demonstrate WFL pain free ROM (flexion and extension) in left hip, for increased mobility and maximal efficiency of gait cycle during ambulation. Baseline: see objective Goal status: INITIAL  4.  Pt will demonstrate at least 4/5 MMT for right lower extremity for increased strength during ADL and community ambulation. Baseline: see objective Goal status: INITIAL  5.  Pt will improve 5TSTS by at least 2.3 seconds in order to improve strength during functional activities. Baseline: see objective Goal status: INITIAL    PLAN:  PT FREQUENCY: 1-2x/week  PT DURATION: 6 weeks  PLANNED INTERVENTIONS: 97110-Therapeutic exercises, 97530- Therapeutic activity, 97112- Neuromuscular re-education, 97535- Self Care, 02859- Manual therapy, [phone]- Gait training, Patient/Family education, Balance training, Stair training, Joint mobilization, DME instructions, Cryotherapy, and Moist heat  PLAN FOR NEXT SESSION: Formally assess strength of bilateral hip internal/external rotation and extension,  progress hip and proximal LE strengthening, incorporate balance  Augustin Mclean, LPTA/CLT; CBIS [phone]  4:43 PM, 03/07/24

## 2024-03-12 ENCOUNTER — Encounter (HOSPITAL_COMMUNITY)

## 2024-03-15 ENCOUNTER — Ambulatory Visit (HOSPITAL_COMMUNITY)

## 2024-03-15 ENCOUNTER — Encounter (HOSPITAL_COMMUNITY): Payer: Self-pay

## 2024-03-15 DIAGNOSIS — Z7409 Other reduced mobility: Secondary | ICD-10-CM | POA: Insufficient documentation

## 2024-03-15 DIAGNOSIS — M25552 Pain in left hip: Secondary | ICD-10-CM | POA: Diagnosis present

## 2024-03-15 NOTE — Therapy (Signed)
 OUTPATIENT PHYSICAL THERAPY LOWER EXTREMITY TREATMENT   Patient Name: Deborah Stevenson MRN: [id number] DOB:1945/09/25, 78 y.o., female Today's Date: 03/15/2024  END OF SESSION:  PT End of Session - 03/15/24 1501     Visit Number 4    Number of Visits 12    Date for Recertification  03/22/24    Authorization Type [employer] MEDIC[employer] CHOICE PPO    Authorization Time Period cohere approved 10 visits from 02/09/24-05/09/2024    Authorization - Visit Number 3    Authorization - Number of Visits 10    Progress Note Due on Visit 10    PT Start Time 1503    PT Stop Time 1542    PT Time Calculation (min) 39 min    Activity Tolerance Patient tolerated treatment well;Patient limited by pain   Pain with flexion based and SLS activities   Behavior During Therapy [facility] for tasks assessed/performed           Past Medical History:  Diagnosis Date   Depression    Hypertension    Past Surgical History:  Procedure Laterality Date   BREAST EXCISIONAL BIOPSY Left    CESAREAN SECTION     Patient Active Problem List   Diagnosis Date Noted   Acute appendicitis with peritoneal abscess 08/24/2015   Essential hypertension 08/24/2015    PCP: Verena Mems, MD   REFERRING PROVIDER: Reyne Cordella SQUIBB, MD  REFERRING DIAG: lt hip pain  THERAPY DIAG:  Pain in left hip  Impaired functional mobility, balance, and endurance  Rationale for Evaluation and Treatment: Rehabilitation  ONSET DATE: over 6 months  SUBJECTIVE:   SUBJECTIVE STATEMENT: 03/15/24:  No reports of pain currently, does have some discomfort.  Exercises going well at home.  Most difficulty going up stairs and balance  Eval: Pt states she has had therapy before for low back pain, therapy helped. Pt states she has bumped the left hip but can not say what started the left hip pain, it comes and goes, was a 7/10 this last weekend. Pt states she is fine most mornings but by the afternoon the left hip is hurting. Pt states she  was able to walk 2 miles everyday in the Spring earlier this year and has not been able to do that due to increased pain. Pt states she has MRI scheduled for left hip soon. Pt reports she does low back exercises, some at least everyday.  PERTINENT HISTORY: Osteoporosis PAIN:  Are you having pain? Yes: NPRS scale: 7/10 Pain location: left lateral hip and bilateral quad Pain description: achey, soreness Aggravating factors: laying on left side, walking a long time, sitting a long time, stairs Relieving factors: ice, tylenol , rest  PRECAUTIONS: None  RED FLAGS: None   WEIGHT BEARING RESTRICTIONS: No  FALLS:  Has patient fallen in last 6 months? No  LIVING ENVIRONMENT: Lives with: lives with their spouse Lives in: House/apartment Stairs: Yes: External: 4 steps; on right going up Has following equipment at home: pt states she uses a walking sticks for long walks  OCCUPATION: school teacher  PLOF: Independent  PATIENT GOALS: decrease pain in left, increase walking tolerance, strengthen LEs  NEXT MD VISIT: MRI next Thursday  OBJECTIVE:  Note: Objective measures were completed at Evaluation unless otherwise noted.  DIAGNOSTIC FINDINGS: None  PATIENT SURVEYS:  LEFS : 37 / 80 = 46.3 %   COGNITION: Overall cognitive status: Within functional limits for tasks assessed     SENSATION: WFL  EDEMA:  None reported  PALPATION: Tenderness to palpation to anterior lateral thigh more on left side. Most tenderness noted on left greater trochanter  LOWER EXTREMITY ROM:  Active ROM Right eval Left eval  Hip flexion [facility] WFL, pain with OP  Hip extension    Hip abduction    Hip adduction    Hip internal rotation [facility]  Hip external rotation 43 35, pain  Knee flexion    Knee extension    Ankle dorsiflexion    Ankle plantarflexion    Ankle inversion    Ankle eversion     (Blank rows = not tested)  LOWER EXTREMITY MMT:  MMT Right eval Left eval Right 03/07/24  Left 03/07/24  Hip flexion 4 3+, pain    Hip extension   4- 4-  Hip abduction 4 3+, pain    Hip adduction 4 3+    Hip internal rotation   4-/5 3+  Hip external rotation   3+ 3/5 *  Knee flexion      Knee extension      Ankle dorsiflexion      Ankle plantarflexion      Ankle inversion      Ankle eversion       (Blank rows = not tested)  *=pain  LOWER EXTREMITY SPECIAL TESTS:  Hip special tests: Belvie (FABER) test: positive   FUNCTIONAL TESTS:  5 times sit to stand: 15.5 seconds, no increased pain 2 minute walk test: 591 feet, no increased hip pain reported SLS 02/09/24/25: R: 11.49 sec L: 24.02 sec  GAIT: Distance walked: 650 feet Assistive device utilized: None Level of assistance: Complete Independence Comments: Pt demonstrates minimal compensations in gait cycle during with no increase in hip pain reported, pt states it takes about a mile for pain to come on. Pt demonstrates slight toe whip of  RLE and flat foot more pronounced on the R foot as well.                                                                                                                                 TREATMENT DATE:  03/15/24: Laurene UE/LE [place] x 5' goal SPM 65  Standing:  - Rockerboard R/L then Df/Pf 1' each with HHA  - 6in step up and over 1 HR assistance 8 reps- increased pain lateral aspect of hip to 6/10  - Toe tapping 6in step height 20x no HHA  - STS 10x no HHA  - Squat front of chair 10x  - posterior lunge over foam   - sidestep with GTB 3RT  - Tandem stance 2x 30 on foam  - Hip mobility side bend and rotation 5x each   03/07/24: MMT for IR/ER and hip extension see above Sidelying:  -Clam with RTB 10x 5 STS with RTB around thighs to reduce valgus 10x 5 Standing: - Sidestep with GTB around thighs 2RT - Toe tapping 6in step 20x  - Lateral toe tapping  between 6in and 8in step 10x each side - Vector stance 3x 5 holds - Tandem stance 1x 30 on solid floor then  2x 30 on foam   02/27/2024  Therapeutic Exercise: -Supine bridges 1 sets of 10 reps, 5 second holds, GTB at knees, pt cued for max hip extension -Standing 3 way hip 1 sets 8 reps, bilaterally, pt cued for upright trunk and maintaining of neutral spine, GTB at knees -Lateral stepping 2 laps 10 steps per lap, with GTB around knees, pt cued for upright posture and core activation -Forward lunges on bosu ball, 1 set of 5 reps better performance going into RLE, pt cued for core activation and upright posture -Hip hike, 1 sets of 10 reps, on 2 inch step, pt cued for hip activation Therapeutic Activity: -Stair navigation, 2 bouts, 4 stairs, pt cued for minimal UE support and leveling sensation of hips  02/09/2024  Evaluation: -ROM measured, Strength assessed, HEP prescribed, pt educated on prognosis, findings, and importance of HEP compliance if given.      PATIENT EDUCATION:  Education details: Pt was educated on findings of PT evaluation, prognosis, frequency of therapy visits and rationale, attendance policy, and HEP if given.   Person educated: Patient Education method: Explanation and Verbal cues Education comprehension: verbalized understanding, verbal cues required, and tactile cues required  HOME EXERCISE PROGRAM: Access Code: [email or web address] URL: [email or web address] Date: 02/09/2024 Prepared by: Lang Ada  Exercises - Supine Bridge  - 1 x daily - 7 x weekly - 3 sets - 10 reps - 5 hold - Standing Single Leg Stance with Counter Support  - 1 x daily - 7 x weekly - 1 sets - 3 reps - 30 hold - Sit to Stand with Arms Crossed  - 1 x daily - 7 x weekly - 3 sets - 10 reps  03/07/24:  - Side Stepping with Resistance at Thighs and Counter Support  - 2 x daily - 7 x weekly - 3 sets - 10 reps - Clam with Resistance  - 1 x daily - 7 x weekly - 2 sets - 10 reps - 5 hold  ASSESSMENT:  CLINICAL IMPRESSION: Session focus with hip strengthening and balance training.  Added step  up/down for functional strengthening, pt reports increased discomfort/pain with flexion based exercises.  Continued with gluteal strengthening exercises and balance activities with theraband resistance and dynamic surfaces.  Pt completed all exercises with good form and mechanics though reports of increased pain at EOS.     Eval: Patient is a 78 y.o. female who was seen today for physical therapy evaluation and treatment for lt hip pain. Patient demonstrates decreased LE strength, abnormal pain rating, and impaired balance. Patient also demonstrates good performance with ambulation during today's session with good pace observed during with no reproduction of symptoms. Patient does demonstrate reproduction of symptoms during strength testing of hip flexion and abduction as well as tenderness to palpation of greater trochanter/bursae, iliopsoas busra and posterior gluteal musculature. Patient requires education on prognosis of likely contributing factors to left hip pain with OP with flexion ROM and FABER test as well as possible anterior and lateral hip bursitis and tendonitis of hip flexors and hip abductors. Patient would benefit from skilled physical therapy for decreased left hip pain, increased endurance with ambulation, increased LE strength, and balance for improved gait quality, return to higher level of function with ADLs, and progress towards therapy goals.    OBJECTIVE IMPAIRMENTS: decreased activity tolerance, decreased balance, decreased endurance, decreased  knowledge of use of DME, decreased mobility, difficulty walking, decreased ROM, decreased strength, and pain.   ACTIVITY LIMITATIONS: carrying, lifting, bending, sitting, standing, squatting, sleeping, stairs, transfers, and bed mobility  PARTICIPATION LIMITATIONS: meal prep, laundry, shopping, community activity, and yard work  PERSONAL FACTORS: Age, Fitness, Past/current experiences, Time since onset of injury/illness/exacerbation,  and 1 comorbidity: hx of low back pain are also affecting patient's functional outcome.   REHAB POTENTIAL: Fair chronic in nature  CLINICAL DECISION MAKING: Stable/uncomplicated  EVALUATION COMPLEXITY: Low   GOALS: Goals reviewed with patient? No  SHORT TERM GOALS: Target date: 03/01/24  Patient will demonstrate evidence of independence with individualized HEP and will report compliance for at least 3 days per week for optimized progression towards remaining therapy goals. Baseline:  Goal status: INITIAL  2.  Patient will report a decrease in pain level during community ambulation by at least 2 points for improved quality of life. Baseline: 7/10 Goal status: INITIAL     LONG TERM GOALS: Target date: 03/22/24  Pt will demonstrate a an increase of at least 9 points on the LEFS for improved performance of community ambulation and ADL. Baseline: see objective Goal status: INITIAL  2.  Pt will improve 2 MWT by 40 feet in order to demonstrate improved functional ambulatory capacity in community setting.  Baseline: see objective Goal status: INITIAL  3.  Pt will demonstrate WFL pain free ROM (flexion and extension) in left hip, for increased mobility and maximal efficiency of gait cycle during ambulation. Baseline: see objective Goal status: INITIAL  4.  Pt will demonstrate at least 4/5 MMT for right lower extremity for increased strength during ADL and community ambulation. Baseline: see objective Goal status: INITIAL  5.  Pt will improve 5TSTS by at least 2.3 seconds in order to improve strength during functional activities. Baseline: see objective Goal status: INITIAL    PLAN:  PT FREQUENCY: 1-2x/week  PT DURATION: 6 weeks  PLANNED INTERVENTIONS: 97110-Therapeutic exercises, 97530- Therapeutic activity, 97112- Neuromuscular re-education, 97535- Self Care, 02859- Manual therapy, [phone]- Gait training, Patient/Family education, Balance training, Stair training, Joint  mobilization, DME instructions, Cryotherapy, and Moist heat  PLAN FOR NEXT SESSION:  progress hip and proximal LE strengthening, incorporate balance  Augustin Mclean, LPTA/CLT; CBIS [phone]  3:51 PM, 03/15/24

## 2024-03-19 ENCOUNTER — Ambulatory Visit (HOSPITAL_COMMUNITY)

## 2024-03-19 ENCOUNTER — Encounter (HOSPITAL_COMMUNITY): Payer: Self-pay

## 2024-03-19 DIAGNOSIS — M25552 Pain in left hip: Secondary | ICD-10-CM | POA: Diagnosis not present

## 2024-03-19 DIAGNOSIS — Z7409 Other reduced mobility: Secondary | ICD-10-CM

## 2024-03-19 NOTE — Therapy (Signed)
 OUTPATIENT PHYSICAL THERAPY LOWER EXTREMITY TREATMENT   Patient Name: Deborah Stevenson MRN: [id number] DOB:[birth year], 78 y.o., female Today's Date: 03/19/2024  END OF SESSION:  PT End of Session - 03/19/24 1550     Visit Number 5    Number of Visits 12    Date for Recertification  03/22/24    Authorization Type [employer]IC[employer] CHOICE PPO    Authorization Time Period cohere approved 10 visits from 02/09/24-05/09/2024    Authorization - Visit Number 4    Authorization - Number of Visits 10    Progress Note Due on Visit 10    PT Start Time 1550    PT Stop Time 1628    PT Time Calculation (min) 38 min    Activity Tolerance Patient tolerated treatment well;Patient limited by pain    Behavior During Therapy [facility] for tasks assessed/performed            Past Medical History:  Diagnosis Date   Depression    Hypertension    Past Surgical History:  Procedure Laterality Date   BREAST EXCISIONAL BIOPSY Left    CESAREAN SECTION     Patient Active Problem List   Diagnosis Date Noted   Acute appendicitis with peritoneal abscess 08/24/2015   Essential hypertension 08/24/2015    PCP: Verena Mems, MD   REFERRING PROVIDER: Reyne Cordella SQUIBB, MD  REFERRING DIAG: lt hip pain  THERAPY DIAG:  Pain in left hip  Impaired functional mobility, balance, and endurance  Rationale for Evaluation and Treatment: Rehabilitation  ONSET DATE: over 6 months  SUBJECTIVE:   SUBJECTIVE STATEMENT: Pt states she has had a really busy day, pt state left hip is still giving her trouble, rated 2/10. Pt states HEP is going well except for standing on LLE only for 30 seconds. Pt states she is not sure if therapy is helping but hopes it is. Pt states she got results from MRI and has a torn muscle, requested for her to bring copy in if possible.    Eval: Pt states she has had therapy before for low back pain, therapy helped. Pt states she has bumped the left hip but can not say what started  the left hip pain, it comes and goes, was a 7/10 this last weekend. Pt states she is fine most mornings but by the afternoon the left hip is hurting. Pt states she was able to walk 2 miles everyday in the Spring earlier this year and has not been able to do that due to increased pain. Pt states she has MRI scheduled for left hip soon. Pt reports she does low back exercises, some at least everyday.  PERTINENT HISTORY: Osteoporosis PAIN:  Are you having pain? Yes: NPRS scale: 7/10 Pain location: left lateral hip and bilateral quad Pain description: achey, soreness Aggravating factors: laying on left side, walking a long time, sitting a long time, stairs Relieving factors: ice, tylenol , rest  PRECAUTIONS: None  RED FLAGS: None   WEIGHT BEARING RESTRICTIONS: No  FALLS:  Has patient fallen in last 6 months? No  LIVING ENVIRONMENT: Lives with: lives with their spouse Lives in: House/apartment Stairs: Yes: External: 4 steps; on right going up Has following equipment at home: pt states she uses a walking sticks for long walks  OCCUPATION: school teacher  PLOF: Independent  PATIENT GOALS: decrease pain in left, increase walking tolerance, strengthen LEs  NEXT MD VISIT: MRI next Thursday  OBJECTIVE:  Note: Objective measures were completed at Evaluation unless otherwise noted.  DIAGNOSTIC FINDINGS: None  PATIENT SURVEYS:  LEFS : 37 / 80 = 46.3 %   COGNITION: Overall cognitive status: Within functional limits for tasks assessed     SENSATION: WFL  EDEMA:  None reported   PALPATION: Tenderness to palpation to anterior lateral thigh more on left side. Most tenderness noted on left greater trochanter  LOWER EXTREMITY ROM:  Active ROM Right eval Left eval  Hip flexion [facility] WFL, pain with OP  Hip extension    Hip abduction    Hip adduction    Hip internal rotation [facility]  Hip external rotation 43 35, pain  Knee flexion    Knee extension    Ankle dorsiflexion     Ankle plantarflexion    Ankle inversion    Ankle eversion     (Blank rows = not tested)  LOWER EXTREMITY MMT:  MMT Right eval Left eval Right 03/07/24 Left 03/07/24  Hip flexion 4 3+, pain    Hip extension   4- 4-  Hip abduction 4 3+, pain    Hip adduction 4 3+    Hip internal rotation   4-/5 3+  Hip external rotation   3+ 3/5 *  Knee flexion      Knee extension      Ankle dorsiflexion      Ankle plantarflexion      Ankle inversion      Ankle eversion       (Blank rows = not tested)  *=pain  LOWER EXTREMITY SPECIAL TESTS:  Hip special tests: Belvie (FABER) test: positive   FUNCTIONAL TESTS:  5 times sit to stand: 15.5 seconds, no increased pain 2 minute walk test: 591 feet, no increased hip pain reported SLS 02/09/24/25: R: 11.49 sec L: 24.02 sec  GAIT: Distance walked: 650 feet Assistive device utilized: None Level of assistance: Complete Independence Comments: Pt demonstrates minimal compensations in gait cycle during with no increase in hip pain reported, pt states it takes about a mile for pain to come on. Pt demonstrates slight toe whip of  RLE and flat foot more pronounced on the R foot as well.                                                                                                                                 TREATMENT DATE:  03/19/2024  Therapeutic Exercise: -Nustep, 5 minutes, level 6 resistance, pt cued for 70 spm -Leg press, 2 sets of 10 reps, plate 4>5, pt cued for decreased knee valgus -Hamstring curls, 2 sets of 10 reps, plate 4, pt cued for eccentric control Therapeutic Activity: -Sled push/pull, 30lbs, 50 foot laps, pt cued for LE sequencing, 4 laps, pt cued for increased pace -Agility ladder 5 laps, diagonals, hop scotch and grape vine variations, pt cued for sequencing  03/15/24: Nustep UE/LE [employer] x 5' goal SPM 65  Standing:  - Rockerboard R/L then Df/Pf 1' each with HHA  -  6in step up and over 1 HR assistance 8  reps- increased pain lateral aspect of hip to 6/10  - Toe tapping 6in step height 20x no HHA  - STS 10x no HHA  - Squat front of chair 10x  - posterior lunge over foam   - sidestep with GTB 3RT  - Tandem stance 2x 30 on foam  - Hip mobility side bend and rotation 5x each   03/07/24: MMT for IR/ER and hip extension see above Sidelying:  -Clam with RTB 10x 5 STS with RTB around thighs to reduce valgus 10x 5 Standing: - Sidestep with GTB around thighs 2RT - Toe tapping 6in step 20x  - Lateral toe tapping between 6in and 8in step 10x each side - Vector stance 3x 5 holds - Tandem stance 1x 30 on solid floor then 2x 30 on foam       PATIENT EDUCATION:  Education details: Pt was educated on findings of PT evaluation, prognosis, frequency of therapy visits and rationale, attendance policy, and HEP if given.   Person educated: Patient Education method: Explanation and Verbal cues Education comprehension: verbalized understanding, verbal cues required, and tactile cues required  HOME EXERCISE PROGRAM: Access Code: [email or web address] URL: [email or web address] Date: 02/09/2024 Prepared by: Lang Ada  Exercises - Supine Bridge  - 1 x daily - 7 x weekly - 3 sets - 10 reps - 5 hold - Standing Single Leg Stance with Counter Support  - 1 x daily - 7 x weekly - 1 sets - 3 reps - 30 hold - Sit to Stand with Arms Crossed  - 1 x daily - 7 x weekly - 3 sets - 10 reps  03/07/24:  - Side Stepping with Resistance at Thighs and Counter Support  - 2 x daily - 7 x weekly - 3 sets - 10 reps - Clam with Resistance  - 1 x daily - 7 x weekly - 2 sets - 10 reps - 5 hold  ASSESSMENT:  CLINICAL IMPRESSION: Patient continues to demonstrate increased pain in left hip, decreased LE strength, decreased gait quality and balance. Patient also demonstrates increased endurance with aerobic based exercise during today's session with increased resistance. Patient able to progress dynamic balance  and core activation exercises today with agility ladder and LE strengthening machines, good performance with verbal cueing. Pt continuing to demonstrate increased symptoms with single leg stance activity on LLE. Patient would continue to benefit from skilled physical therapy for decreased hip pain, increased endurance with ambulation, increased LE strength, and improved balance for improved quality of life, improved independence with gait training and continued progress towards therapy goals.    Eval: Patient is a 78 y.o. female who was seen today for physical therapy evaluation and treatment for lt hip pain. Patient demonstrates decreased LE strength, abnormal pain rating, and impaired balance. Patient also demonstrates good performance with ambulation during today's session with good pace observed during with no reproduction of symptoms. Patient does demonstrate reproduction of symptoms during strength testing of hip flexion and abduction as well as tenderness to palpation of greater trochanter/bursae, iliopsoas busra and posterior gluteal musculature. Patient requires education on prognosis of likely contributing factors to left hip pain with OP with flexion ROM and FABER test as well as possible anterior and lateral hip bursitis and tendonitis of hip flexors and hip abductors. Patient would benefit from skilled physical therapy for decreased left hip pain, increased endurance with ambulation, increased LE strength, and balance for improved gait quality, return to  higher level of function with ADLs, and progress towards therapy goals.    OBJECTIVE IMPAIRMENTS: decreased activity tolerance, decreased balance, decreased endurance, decreased knowledge of use of DME, decreased mobility, difficulty walking, decreased ROM, decreased strength, and pain.   ACTIVITY LIMITATIONS: carrying, lifting, bending, sitting, standing, squatting, sleeping, stairs, transfers, and bed mobility  PARTICIPATION  LIMITATIONS: meal prep, laundry, shopping, community activity, and yard work  PERSONAL FACTORS: Age, Fitness, Past/current experiences, Time since onset of injury/illness/exacerbation, and 1 comorbidity: hx of low back pain are also affecting patient's functional outcome.   REHAB POTENTIAL: Fair chronic in nature  CLINICAL DECISION MAKING: Stable/uncomplicated  EVALUATION COMPLEXITY: Low   GOALS: Goals reviewed with patient? No  SHORT TERM GOALS: Target date: 03/01/24  Patient will demonstrate evidence of independence with individualized HEP and will report compliance for at least 3 days per week for optimized progression towards remaining therapy goals. Baseline:  Goal status: INITIAL  2.  Patient will report a decrease in pain level during community ambulation by at least 2 points for improved quality of life. Baseline: 7/10 Goal status: INITIAL     LONG TERM GOALS: Target date: 03/22/24  Pt will demonstrate a an increase of at least 9 points on the LEFS for improved performance of community ambulation and ADL. Baseline: see objective Goal status: INITIAL  2.  Pt will improve 2 MWT by 40 feet in order to demonstrate improved functional ambulatory capacity in community setting.  Baseline: see objective Goal status: INITIAL  3.  Pt will demonstrate WFL pain free ROM (flexion and extension) in left hip, for increased mobility and maximal efficiency of gait cycle during ambulation. Baseline: see objective Goal status: INITIAL  4.  Pt will demonstrate at least 4/5 MMT for right lower extremity for increased strength during ADL and community ambulation. Baseline: see objective Goal status: INITIAL  5.  Pt will improve 5TSTS by at least 2.3 seconds in order to improve strength during functional activities. Baseline: see objective Goal status: INITIAL    PLAN:  PT FREQUENCY: 1-2x/week  PT DURATION: 6 weeks  PLANNED INTERVENTIONS: 97110-Therapeutic exercises, 97530-  Therapeutic activity, 97112- Neuromuscular re-education, 97535- Self Care, 02859- Manual therapy, [phone]- Gait training, Patient/Family education, Balance training, Stair training, Joint mobilization, DME instructions, Cryotherapy, and Moist heat  PLAN FOR NEXT SESSION:  progress hip and proximal LE strengthening, incorporate balance  Lang Ada, PT, DPT [facility] Office: [phone] 4:32 PM, 03/19/24

## 2024-03-22 ENCOUNTER — Encounter (HOSPITAL_COMMUNITY)

## 2024-03-26 ENCOUNTER — Encounter (HOSPITAL_COMMUNITY)

## 2024-03-28 ENCOUNTER — Encounter (HOSPITAL_COMMUNITY)

## 2024-03-29 ENCOUNTER — Ambulatory Visit (HOSPITAL_COMMUNITY)

## 2024-03-29 ENCOUNTER — Encounter (HOSPITAL_COMMUNITY): Payer: Self-pay

## 2024-03-29 DIAGNOSIS — M25552 Pain in left hip: Secondary | ICD-10-CM

## 2024-03-29 DIAGNOSIS — Z7409 Other reduced mobility: Secondary | ICD-10-CM

## 2024-03-29 NOTE — Therapy (Signed)
 OUTPATIENT PHYSICAL THERAPY LOWER EXTREMITY TREATMENT   Patient Name: Neidra Girvan MRN: [id number] DOB:01-15-1946, 78 y.o., female Today's Date: 03/29/2024  END OF SESSION:  PT End of Session - 03/29/24 1502     Visit Number 6    Number of Visits 12    Date for Recertification  03/22/24    Authorization Type [employer] CHO[employer] PPO    Authorization Time Period cohere approved 10 visits from 02/09/24-05/09/2024    Authorization - Visit Number 5    Authorization - Number of Visits 10    Progress Note Due on Visit 10    PT Start Time 1502    PT Stop Time 1542    PT Time Calculation (min) 40 min    Activity Tolerance Patient tolerated treatment well;Patient limited by pain    Behavior During Therapy [facility] for tasks assessed/performed             Past Medical History:  Diagnosis Date   Depression    Hypertension    Past Surgical History:  Procedure Laterality Date   BREAST EXCISIONAL BIOPSY Left    CESAREAN SECTION     Patient Active Problem List   Diagnosis Date Noted   Acute appendicitis with peritoneal abscess 08/24/2015   Essential hypertension 08/24/2015    PCP: Verena Mems, MD   REFERRING PROVIDER: Reyne Cordella SQUIBB, MD  REFERRING DIAG: lt hip pain  THERAPY DIAG:  Pain in left hip  Impaired functional mobility, balance, and endurance  Rationale for Evaluation and Treatment: Rehabilitation  ONSET DATE: over 6 months  SUBJECTIVE:   SUBJECTIVE STATEMENT: Pt states hip is doing better but has had a really busy last two week. Pt states no pain in the hip upon presentation today, a little in left knee. Pt still trying to get us  a copy of the MRI.    Eval: Pt states she has had therapy before for low back pain, therapy helped. Pt states she has bumped the left hip but can not say what started the left hip pain, it comes and goes, was a 7/10 this last weekend. Pt states she is fine most mornings but by the afternoon the left hip is hurting. Pt  states she was able to walk 2 miles everyday in the Spring earlier this year and has not been able to do that due to increased pain. Pt states she has MRI scheduled for left hip soon. Pt reports she does low back exercises, some at least everyday.  PERTINENT HISTORY: Osteoporosis PAIN:  Are you having pain? Yes: NPRS scale: 7/10 Pain location: left lateral hip and bilateral quad Pain description: achey, soreness Aggravating factors: laying on left side, walking a long time, sitting a long time, stairs Relieving factors: ice, tylenol , rest  PRECAUTIONS: None  RED FLAGS: None   WEIGHT BEARING RESTRICTIONS: No  FALLS:  Has patient fallen in last 6 months? No  LIVING ENVIRONMENT: Lives with: lives with their spouse Lives in: House/apartment Stairs: Yes: External: 4 steps; on right going up Has following equipment at home: pt states she uses a walking sticks for long walks  OCCUPATION: school teacher  PLOF: Independent  PATIENT GOALS: decrease pain in left, increase walking tolerance, strengthen LEs  NEXT MD VISIT: MRI next Thursday  OBJECTIVE:  Note: Objective measures were completed at Evaluation unless otherwise noted.  DIAGNOSTIC FINDINGS: None  PATIENT SURVEYS:  LEFS : 37 / 80 = 46.3 %   COGNITION: Overall cognitive status: Within functional limits for tasks assessed  SENSATION: WFL  EDEMA:  None reported   PALPATION: Tenderness to palpation to anterior lateral thigh more on left side. Most tenderness noted on left greater trochanter  LOWER EXTREMITY ROM:  Active ROM Right eval Left eval  Hip flexion [facility] WFL, pain with OP  Hip extension    Hip abduction    Hip adduction    Hip internal rotation [facility]  Hip external rotation 43 35, pain  Knee flexion    Knee extension    Ankle dorsiflexion    Ankle plantarflexion    Ankle inversion    Ankle eversion     (Blank rows = not tested)  LOWER EXTREMITY MMT:  MMT Right eval Left eval  Right 03/07/24 Left 03/07/24  Hip flexion 4 3+, pain    Hip extension   4- 4-  Hip abduction 4 3+, pain    Hip adduction 4 3+    Hip internal rotation   4-/5 3+  Hip external rotation   3+ 3/5 *  Knee flexion      Knee extension      Ankle dorsiflexion      Ankle plantarflexion      Ankle inversion      Ankle eversion       (Blank rows = not tested)  *=pain  LOWER EXTREMITY SPECIAL TESTS:  Hip special tests: Belvie (FABER) test: positive   FUNCTIONAL TESTS:  5 times sit to stand: 15.5 seconds, no increased pain 2 minute walk test: 591 feet, no increased hip pain reported SLS 02/09/24/25: R: 11.49 sec L: 24.02 sec  GAIT: Distance walked: 650 feet Assistive device utilized: None Level of assistance: Complete Independence Comments: Pt demonstrates minimal compensations in gait cycle during with no increase in hip pain reported, pt states it takes about a mile for pain to come on. Pt demonstrates slight toe whip of  RLE and flat foot more pronounced on the R foot as well.                                                                                                                                 TREATMENT DATE:  03/29/2024  Therapeutic Exercise: -Stationary bike, 5 minutes, level 3 resistance, pt cued for 50 spm -Standing 3 way kick, 4lb ankle weights, pt cued for segmental movements and max pain free ROM Therapeutic Activity: -Bosu ball lunges, 1 set of 7 reps, pt cued for decreased UE support -Upside down bosu ball squats, 1 set of 8 reps, pt requires BUE support and SBA for safety -Walking marches/butt kicks, 2 laps on 20 foot line, pt cued for max pain free ROM (pops audible on LLE) -Cone touches on 6 inch step while wearing 4lb ankle weights and standing on blue foam, pt cued for decrease -Agility ladder 2 laps,  grape vine variations, pt cued for sequencing, 4 lb ankle weights  03/19/2024  Therapeutic Exercise: -Nustep, 5 minutes, level 6 resistance,  pt cued for  70 spm -Leg press, 2 sets of 10 reps, plate 4>5, pt cued for decreased knee valgus -Hamstring curls, 2 sets of 10 reps, plate 4, pt cued for eccentric control Therapeutic Activity: -Sled push/pull, 30lbs, 50 foot laps, pt cued for LE sequencing, 4 laps, pt cued for increased pace -Agility ladder 5 laps, diagonals, hop scotch and grape vine variations, pt cued for sequencing  03/15/24: Nustep UE/LE [employer] x 5' goal SPM 65  Standing:  - Rockerboard R/L then Df/Pf 1' each with HHA  - 6in step up and over 1 HR assistance 8 reps- increased pain lateral aspect of hip to 6/10  - Toe tapping 6in step height 20x no HHA  - STS 10x no HHA  - Squat front of chair 10x  - posterior lunge over foam   - sidestep with GTB 3RT  - Tandem stance 2x 30 on foam  - Hip mobility side bend and rotation 5x each   PATIENT EDUCATION:  Education details: Pt was educated on findings of PT evaluation, prognosis, frequency of therapy visits and rationale, attendance policy, and HEP if given.   Person educated: Patient Education method: Explanation and Verbal cues Education comprehension: verbalized understanding, verbal cues required, and tactile cues required  HOME EXERCISE PROGRAM: Access Code: [email or web address] URL: [email or web address] Date: 02/09/2024 Prepared by: Lang Ada  Exercises - Supine Bridge  - 1 x daily - 7 x weekly - 3 sets - 10 reps - 5 hold - Standing Single Leg Stance with Counter Support  - 1 x daily - 7 x weekly - 1 sets - 3 reps - 30 hold - Sit to Stand with Arms Crossed  - 1 x daily - 7 x weekly - 3 sets - 10 reps  03/07/24:  - Side Stepping with Resistance at Thighs and Counter Support  - 2 x daily - 7 x weekly - 3 sets - 10 reps - Clam with Resistance  - 1 x daily - 7 x weekly - 2 sets - 10 reps - 5 hold  ASSESSMENT:  CLINICAL IMPRESSION: Patient continues to demonstrate decreased LLE strength, decreased gait quality and balance. Patient also demonstrates left  hip popping with left hip flexion past 100 degrees during walking marches cued to decrease ROM for this activity although no increase in symptoms noted.  Patient able to progress dynamic balance and core activation exercises today with weighted agility ladder work and step up variations, good performance with verbal cueing. Patient would continue to benefit from skilled physical therapy for decreased left hip pain, increased endurance with ambulation, increased LE strength/stability, and improved balance for improved quality of life, improved independence with management of left hip health and continued progress towards therapy goals. Plan to recertification for 4 more weeks due to pt only being seen for 5 visit so far, minimal time for optimal benefits.      Eval: Patient is a 78 y.o. female who was seen today for physical therapy evaluation and treatment for lt hip pain. Patient demonstrates decreased LE strength, abnormal pain rating, and impaired balance. Patient also demonstrates good performance with ambulation during today's session with good pace observed during with no reproduction of symptoms. Patient does demonstrate reproduction of symptoms during strength testing of hip flexion and abduction as well as tenderness to palpation of greater trochanter/bursae, iliopsoas busra and posterior gluteal musculature. Patient requires education on prognosis of likely contributing factors to left hip pain with OP with flexion ROM and FABER test  as well as possible anterior and lateral hip bursitis and tendonitis of hip flexors and hip abductors. Patient would benefit from skilled physical therapy for decreased left hip pain, increased endurance with ambulation, increased LE strength, and balance for improved gait quality, return to higher level of function with ADLs, and progress towards therapy goals.    OBJECTIVE IMPAIRMENTS: decreased activity tolerance, decreased balance, decreased endurance,  decreased knowledge of use of DME, decreased mobility, difficulty walking, decreased ROM, decreased strength, and pain.   ACTIVITY LIMITATIONS: carrying, lifting, bending, sitting, standing, squatting, sleeping, stairs, transfers, and bed mobility  PARTICIPATION LIMITATIONS: meal prep, laundry, shopping, community activity, and yard work  PERSONAL FACTORS: Age, Fitness, Past/current experiences, Time since onset of injury/illness/exacerbation, and 1 comorbidity: hx of low back pain are also affecting patient's functional outcome.   REHAB POTENTIAL: Fair chronic in nature  CLINICAL DECISION MAKING: Stable/uncomplicated  EVALUATION COMPLEXITY: Low   GOALS: Goals reviewed with patient? No  SHORT TERM GOALS: Target date: 03/01/24  Patient will demonstrate evidence of independence with individualized HEP and will report compliance for at least 3 days per week for optimized progression towards remaining therapy goals. Baseline:  Goal status: INITIAL  2.  Patient will report a decrease in pain level during community ambulation by at least 2 points for improved quality of life. Baseline: 7/10 Goal status: INITIAL     LONG TERM GOALS: Target date: 03/22/24  Pt will demonstrate a an increase of at least 9 points on the LEFS for improved performance of community ambulation and ADL. Baseline: see objective Goal status: INITIAL  2.  Pt will improve 2 MWT by 40 feet in order to demonstrate improved functional ambulatory capacity in community setting.  Baseline: see objective Goal status: INITIAL  3.  Pt will demonstrate WFL pain free ROM (flexion and extension) in left hip, for increased mobility and maximal efficiency of gait cycle during ambulation. Baseline: see objective Goal status: INITIAL  4.  Pt will demonstrate at least 4/5 MMT for right lower extremity for increased strength during ADL and community ambulation. Baseline: see objective Goal status: INITIAL  5.  Pt will  improve 5TSTS by at least 2.3 seconds in order to improve strength during functional activities. Baseline: see objective Goal status: INITIAL    PLAN:  PT FREQUENCY: 1-2x/week  PT DURATION: 4 weeks  PLANNED INTERVENTIONS: 97110-Therapeutic exercises, 97530- Therapeutic activity, 97112- Neuromuscular re-education, 97535- Self Care, [id number]- Manual therapy, [phone]- Gait training, Patient/Family education, Balance training, Stair training, Joint mobilization, DME instructions, Cryotherapy, and Moist heat  PLAN FOR NEXT SESSION:  progress hip and proximal LE strengthening, incorporate balance, extend for 4 weeks  Lang Ada, PT, DPT [facility] Office: [phone] 3:11 PM, 03/29/24

## 2024-04-02 ENCOUNTER — Encounter (HOSPITAL_COMMUNITY): Payer: Self-pay

## 2024-04-02 ENCOUNTER — Ambulatory Visit (HOSPITAL_COMMUNITY)

## 2024-04-02 DIAGNOSIS — Z7409 Other reduced mobility: Secondary | ICD-10-CM

## 2024-04-02 DIAGNOSIS — M25552 Pain in left hip: Secondary | ICD-10-CM | POA: Diagnosis not present

## 2024-04-02 NOTE — Therapy (Signed)
 OUTPATIENT PHYSICAL THERAPY LOWER EXTREMITY TREATMENT   Patient Name: Deborah Stevenson MRN: [id number] DOB:September 23, 1945, 78 y.o., female Today's Date: 04/02/2024  END OF SESSION:  PT End of Session - 04/02/24 1557     Visit Number 7    Number of Visits 12    Date for Recertification  04/26/24    Authorization Type [employer] CHO[employer] PPO    Authorization Time Period cohere approved 10 visits from 02/09/24-05/09/2024    Authorization - Visit Number 6    Authorization - Number of Visits 10    Progress Note Due on Visit 10    PT Start Time 1550    PT Stop Time 1628    PT Time Calculation (min) 38 min    Activity Tolerance Patient tolerated treatment well;Patient limited by pain    Behavior During Therapy [facility] for tasks assessed/performed             Past Medical History:  Diagnosis Date   Depression    Hypertension    Past Surgical History:  Procedure Laterality Date   BREAST EXCISIONAL BIOPSY Left    CESAREAN SECTION     Patient Active Problem List   Diagnosis Date Noted   Acute appendicitis with peritoneal abscess 08/24/2015   Essential hypertension 08/24/2015    PCP: Verena Mems, MD   REFERRING PROVIDER: Reyne Cordella SQUIBB, MD  REFERRING DIAG: lt hip pain  THERAPY DIAG:  Pain in left hip  Impaired functional mobility, balance, and endurance  Rationale for Evaluation and Treatment: Rehabilitation  ONSET DATE: over 6 months  SUBJECTIVE:   SUBJECTIVE STATEMENT: Pt states no pain in hip today but was pretty sore following last session, today is first full day of recovery.  Eval: Pt states she has had therapy before for low back pain, therapy helped. Pt states she has bumped the left hip but can not say what started the left hip pain, it comes and goes, was a 7/10 this last weekend. Pt states she is fine most mornings but by the afternoon the left hip is hurting. Pt states she was able to walk 2 miles everyday in the Spring earlier this year and  has not been able to do that due to increased pain. Pt states she has MRI scheduled for left hip soon. Pt reports she does low back exercises, some at least everyday.  PERTINENT HISTORY: Osteoporosis PAIN:  Are you having pain? Yes: NPRS scale: 7/10 Pain location: left lateral hip and bilateral quad Pain description: achey, soreness Aggravating factors: laying on left side, walking a long time, sitting a long time, stairs Relieving factors: ice, tylenol , rest  PRECAUTIONS: None  RED FLAGS: None   WEIGHT BEARING RESTRICTIONS: No  FALLS:  Has patient fallen in last 6 months? No  LIVING ENVIRONMENT: Lives with: lives with their spouse Lives in: House/apartment Stairs: Yes: External: 4 steps; on right going up Has following equipment at home: pt states she uses a walking sticks for long walks  OCCUPATION: school teacher  PLOF: Independent  PATIENT GOALS: decrease pain in left, increase walking tolerance, strengthen LEs  NEXT MD VISIT: MRI next Thursday  OBJECTIVE:  Note: Objective measures were completed at Evaluation unless otherwise noted.  DIAGNOSTIC FINDINGS: None  PATIENT SURVEYS:  LEFS : 37 / 80 = 46.3 %   COGNITION: Overall cognitive status: Within functional limits for tasks assessed     SENSATION: WFL  EDEMA:  None reported   PALPATION: Tenderness to palpation to anterior lateral thigh more  on left side. Most tenderness noted on left greater trochanter  LOWER EXTREMITY ROM:  Active ROM Right eval Left eval  Hip flexion [facility] WFL, pain with OP  Hip extension    Hip abduction    Hip adduction    Hip internal rotation [facility]  Hip external rotation 43 35, pain  Knee flexion    Knee extension    Ankle dorsiflexion    Ankle plantarflexion    Ankle inversion    Ankle eversion     (Blank rows = not tested)  LOWER EXTREMITY MMT:  MMT Right eval Left eval Right 03/07/24 Left 03/07/24  Hip flexion 4 3+, pain    Hip extension   4- 4-  Hip  abduction 4 3+, pain    Hip adduction 4 3+    Hip internal rotation   4-/5 3+  Hip external rotation   3+ 3/5 *  Knee flexion      Knee extension      Ankle dorsiflexion      Ankle plantarflexion      Ankle inversion      Ankle eversion       (Blank rows = not tested)  *=pain  LOWER EXTREMITY SPECIAL TESTS:  Hip special tests: Belvie (FABER) test: positive   FUNCTIONAL TESTS:  5 times sit to stand: 15.5 seconds, no increased pain 2 minute walk test: 591 feet, no increased hip pain reported SLS 02/09/24/25: R: 11.49 sec L: 24.02 sec  GAIT: Distance walked: 650 feet Assistive device utilized: None Level of assistance: Complete Independence Comments: Pt demonstrates minimal compensations in gait cycle during with no increase in hip pain reported, pt states it takes about a mile for pain to come on. Pt demonstrates slight toe whip of  RLE and flat foot more pronounced on the R foot as well.                                                                                                                                 TREATMENT DATE:  03/29/2024  Manual Therapy: -Hip distraction, gait belt method utilized, multiple planes -PROM with end range of motion gentle grade I and II mobilizations -PA grade II and III mobilizations with knee in 90 degrees of flexion Therapeutic Exercise: -Nustep, 3 minutes, level 6 resistance, pt cued for 80 spm -Standing 3 way kick, RTB at ankles pt cued for segmental movements and max pain free ROM, 1 set of 10 reps bilaterally -Clamshells, 2 sets of 10 reps, GTB at knees -Windshield wipers for hip IR/ER, 2 sets of 10 reps, pt cued for stretch of bilateral hips -Monster walks and lateral stepping, GTB at knees/RTB for second lap on 10 foot lap -Captain morgans with smiley face ball on wall, 1 set of 8 reps bilaterally   03/19/2024  Therapeutic Exercise: -Nustep, 5 minutes, level 6 resistance, pt cued for 70 spm -Leg press, 2 sets of 10 reps,  plate 4>5, pt cued for decreased knee valgus -Hamstring curls, 2 sets of 10 reps, plate 4, pt cued for eccentric control Therapeutic Activity: -Sled push/pull, 30lbs, 50 foot laps, pt cued for LE sequencing, 4 laps, pt cued for increased pace -Agility ladder 5 laps, diagonals, hop scotch and grape vine variations, pt cued for sequencing  03/15/24: Nustep UE/LE [employer] x 5' goal SPM 65  Standing:  - Rockerboard R/L then Df/Pf 1' each with HHA  - 6in step up and over 1 HR assistance 8 reps- increased pain lateral aspect of hip to 6/10  - Toe tapping 6in step height 20x no HHA  - STS 10x no HHA  - Squat front of chair 10x  - posterior lunge over foam   - sidestep with GTB 3RT  - Tandem stance 2x 30 on foam  - Hip mobility side bend and rotation 5x each   PATIENT EDUCATION:  Education details: Pt was educated on findings of PT evaluation, prognosis, frequency of therapy visits and rationale, attendance policy, and HEP if given.   Person educated: Patient Education method: Explanation and Verbal cues Education comprehension: verbalized understanding, verbal cues required, and tactile cues required  HOME EXERCISE PROGRAM: Access Code: [email or web address] URL: [email or web address] Date: 02/09/2024 Prepared by: Lang Ada  Exercises - Supine Bridge  - 1 x daily - 7 x weekly - 3 sets - 10 reps - 5 hold - Standing Single Leg Stance with Counter Support  - 1 x daily - 7 x weekly - 1 sets - 3 reps - 30 hold - Sit to Stand with Arms Crossed  - 1 x daily - 7 x weekly - 3 sets - 10 reps  03/07/24:  - Side Stepping with Resistance at Thighs and Counter Support  - 2 x daily - 7 x weekly - 3 sets - 10 reps - Clam with Resistance  - 1 x daily - 7 x weekly - 2 sets - 10 reps - 5 hold  ASSESSMENT:  CLINICAL IMPRESSION: Patient continues to demonstrate decreased LLE strength, decreased gait quality and balance. Switched to banded resistance today to avoid excessive popping, no  popping noted today.  Patient able to continue dynamic balance and core activation exercises today with resisted walking and supine strengthening variations, good performance with verbal cueing. Manual therapy resumed today, pt still demonstrating increased pain at end ranges. Patient would continue to benefit from skilled physical therapy for decreased left hip pain, increased endurance with ambulation, increased LE strength/stability, and improved balance for improved quality of life, improved independence with management of left hip health and continued progress towards therapy goals.      Eval: Patient is a 78 y.o. female who was seen today for physical therapy evaluation and treatment for lt hip pain. Patient demonstrates decreased LE strength, abnormal pain rating, and impaired balance. Patient also demonstrates good performance with ambulation during today's session with good pace observed during with no reproduction of symptoms. Patient does demonstrate reproduction of symptoms during strength testing of hip flexion and abduction as well as tenderness to palpation of greater trochanter/bursae, iliopsoas busra and posterior gluteal musculature. Patient requires education on prognosis of likely contributing factors to left hip pain with OP with flexion ROM and FABER test as well as possible anterior and lateral hip bursitis and tendonitis of hip flexors and hip abductors. Patient would benefit from skilled physical therapy for decreased left hip pain, increased endurance with ambulation, increased LE strength, and balance for improved gait quality,  return to higher level of function with ADLs, and progress towards therapy goals.    OBJECTIVE IMPAIRMENTS: decreased activity tolerance, decreased balance, decreased endurance, decreased knowledge of use of DME, decreased mobility, difficulty walking, decreased ROM, decreased strength, and pain.   ACTIVITY LIMITATIONS: carrying, lifting, bending,  sitting, standing, squatting, sleeping, stairs, transfers, and bed mobility  PARTICIPATION LIMITATIONS: meal prep, laundry, shopping, community activity, and yard work  PERSONAL FACTORS: Age, Fitness, Past/current experiences, Time since onset of injury/illness/exacerbation, and 1 comorbidity: hx of low back pain are also affecting patient's functional outcome.   REHAB POTENTIAL: Fair chronic in nature  CLINICAL DECISION MAKING: Stable/uncomplicated  EVALUATION COMPLEXITY: Low   GOALS: Goals reviewed with patient? No  SHORT TERM GOALS: Target date: 03/01/24  Patient will demonstrate evidence of independence with individualized HEP and will report compliance for at least 3 days per week for optimized progression towards remaining therapy goals. Baseline:  Goal status: INITIAL  2.  Patient will report a decrease in pain level during community ambulation by at least 2 points for improved quality of life. Baseline: 7/10 Goal status: INITIAL     LONG TERM GOALS: Target date: 03/22/24  Pt will demonstrate a an increase of at least 9 points on the LEFS for improved performance of community ambulation and ADL. Baseline: see objective Goal status: INITIAL  2.  Pt will improve 2 MWT by 40 feet in order to demonstrate improved functional ambulatory capacity in community setting.  Baseline: see objective Goal status: INITIAL  3.  Pt will demonstrate WFL pain free ROM (flexion and extension) in left hip, for increased mobility and maximal efficiency of gait cycle during ambulation. Baseline: see objective Goal status: INITIAL  4.  Pt will demonstrate at least 4/5 MMT for right lower extremity for increased strength during ADL and community ambulation. Baseline: see objective Goal status: INITIAL  5.  Pt will improve 5TSTS by at least 2.3 seconds in order to improve strength during functional activities. Baseline: see objective Goal status: INITIAL    PLAN:  PT FREQUENCY:  1-2x/week  PT DURATION: 4 weeks  PLANNED INTERVENTIONS: 97110-Therapeutic exercises, 97530- Therapeutic activity, 97112- Neuromuscular re-education, 97535- Self Care, [id number]- Manual therapy, [phone]- Gait training, Patient/Family education, Balance training, Stair training, Joint mobilization, DME instructions, Cryotherapy, and Moist heat  PLAN FOR NEXT SESSION:  progress hip and proximal LE strengthening, incorporate balance, manual therapy as needed  Lang Ada, PT, DPT [facility] Office: [phone] 4:59 PM, 04/02/24

## 2024-04-05 ENCOUNTER — Encounter (HOSPITAL_COMMUNITY): Payer: Self-pay

## 2024-04-05 ENCOUNTER — Ambulatory Visit (HOSPITAL_COMMUNITY)

## 2024-04-05 DIAGNOSIS — Z7409 Other reduced mobility: Secondary | ICD-10-CM

## 2024-04-05 DIAGNOSIS — M25552 Pain in left hip: Secondary | ICD-10-CM | POA: Diagnosis not present

## 2024-04-05 NOTE — Therapy (Signed)
 OUTPATIENT PHYSICAL THERAPY LOWER EXTREMITY TREATMENT   Patient Name: Deborah Stevenson MRN: [id number] DOB:June 06, 1946, 78 y.o., female Today's Date: 04/05/2024  END OF SESSION:  PT End of Session - 04/05/24 1507     Visit Number 8    Number of Visits 12    Date for Recertification  04/26/24    Authorization Type [employer]IC[employer] CHOICE PPO    Authorization Time Period cohere approved 10 visits from 02/09/24-05/09/2024    Authorization - Visit Number 7    Authorization - Number of Visits 10    Progress Note Due on Visit 10    PT Start Time 1507    PT Stop Time 1545    PT Time Calculation (min) 38 min    Activity Tolerance Patient tolerated treatment well;Patient limited by pain    Behavior During Therapy [facility] for tasks assessed/performed              Past Medical History:  Diagnosis Date   Depression    Hypertension    Past Surgical History:  Procedure Laterality Date   BREAST EXCISIONAL BIOPSY Left    CESAREAN SECTION     Patient Active Problem List   Diagnosis Date Noted   Acute appendicitis with peritoneal abscess 08/24/2015   Essential hypertension 08/24/2015    PCP: Verena Mems, MD   REFERRING PROVIDER: Reyne Cordella SQUIBB, MD  REFERRING DIAG: lt hip pain  THERAPY DIAG:  Pain in left hip  Impaired functional mobility, balance, and endurance  Rationale for Evaluation and Treatment: Rehabilitation  ONSET DATE: over 6 months  SUBJECTIVE:   SUBJECTIVE STATEMENT: Pt states no pain in hip today but was pretty sore following last session. Pt states the HEP is going well.  Eval: Pt states she has had therapy before for low back pain, therapy helped. Pt states she has bumped the left hip but can not say what started the left hip pain, it comes and goes, was a 7/10 this last weekend. Pt states she is fine most mornings but by the afternoon the left hip is hurting. Pt states she was able to walk 2 miles everyday in the Spring earlier this year and has  not been able to do that due to increased pain. Pt states she has MRI scheduled for left hip soon. Pt reports she does low back exercises, some at least everyday.  PERTINENT HISTORY: Osteoporosis PAIN:  Are you having pain? Yes: NPRS scale: 7/10 Pain location: left lateral hip and bilateral quad Pain description: achey, soreness Aggravating factors: laying on left side, walking a long time, sitting a long time, stairs Relieving factors: ice, tylenol , rest  PRECAUTIONS: None  RED FLAGS: None   WEIGHT BEARING RESTRICTIONS: No  FALLS:  Has patient fallen in last 6 months? No  LIVING ENVIRONMENT: Lives with: lives with their spouse Lives in: House/apartment Stairs: Yes: External: 4 steps; on right going up Has following equipment at home: pt states she uses a walking sticks for long walks  OCCUPATION: school teacher  PLOF: Independent  PATIENT GOALS: decrease pain in left, increase walking tolerance, strengthen LEs  NEXT MD VISIT: MRI next Thursday  OBJECTIVE:  Note: Objective measures were completed at Evaluation unless otherwise noted.  DIAGNOSTIC FINDINGS: None  PATIENT SURVEYS:  LEFS : 37 / 80 = 46.3 %   COGNITION: Overall cognitive status: Within functional limits for tasks assessed     SENSATION: WFL  EDEMA:  None reported   PALPATION: Tenderness to palpation to anterior lateral thigh  more on left side. Most tenderness noted on left greater trochanter  LOWER EXTREMITY ROM:  Active ROM Right eval Left eval  Hip flexion [facility] WFL, pain with OP  Hip extension    Hip abduction    Hip adduction    Hip internal rotation [facility]  Hip external rotation 43 35, pain  Knee flexion    Knee extension    Ankle dorsiflexion    Ankle plantarflexion    Ankle inversion    Ankle eversion     (Blank rows = not tested)  LOWER EXTREMITY MMT:  MMT Right eval Left eval Right 03/07/24 Left 03/07/24  Hip flexion 4 3+, pain    Hip extension   4- 4-  Hip  abduction 4 3+, pain    Hip adduction 4 3+    Hip internal rotation   4-/5 3+  Hip external rotation   3+ 3/5 *  Knee flexion      Knee extension      Ankle dorsiflexion      Ankle plantarflexion      Ankle inversion      Ankle eversion       (Blank rows = not tested)  *=pain  LOWER EXTREMITY SPECIAL TESTS:  Hip special tests: [place] (FABER) test: positive   FUNCTIONAL TESTS:  5 times sit to stand: 15.5 seconds, no increased pain 2 minute walk test: 591 feet, no increased hip pain reported SLS 02/09/24/25: R: 11.49 sec L: 24.02 sec  GAIT: Distance walked: 650 feet Assistive device utilized: None Level of assistance: Complete Independence Comments: Pt demonstrates minimal compensations in gait cycle during with no increase in hip pain reported, pt states it takes about a mile for pain to come on. Pt demonstrates slight toe whip of  RLE and flat foot more pronounced on the R foot as well.                                                                                                                                 TREATMENT DATE:  04/05/2024  Therapeutic Exercise: -Nustep, 5 minutes, level 7 resistance, pt cued for 80 spm  Neuromuscular Re-education: -Hip hikes, 1 set of 10 reps, pt cued for decreased UE support -Lateral lunges with towel slide, 1 set of 6 reps bilaterally, pt cued to remain in pain free ROM, performed in parallel bars -Single leg [place] dead lift, 1 set of 8 reps bilaterally, performed in parallel bars, pt cued for decreased UE support on last 3 reps Therapeutic Activity: -Sit to stands with 10 lb KB at chest, 2 sets of 5 reps, pt cued for core activation and staggered LE stance -Step up and overs, 6 inch step and blue foam, 1 bout of 5 reps, 4lb ankle weights, 10lb KB last 2 bouts, march each time on elevated surface  03/29/2024  Manual Therapy: -Hip distraction, gait belt method utilized, multiple planes -PROM with end range of  motion gentle grade  I and II mobilizations -PA grade II and III mobilizations with knee in 90 degrees of flexion Therapeutic Exercise: -Nustep, 3 minutes, level 6 resistance, pt cued for 80 spm -Standing 3 way kick, RTB at ankles pt cued for segmental movements and max pain free ROM, 1 set of 10 reps bilaterally -Clamshells, 2 sets of 10 reps, GTB at knees -Windshield wipers for hip IR/ER, 2 sets of 10 reps, pt cued for stretch of bilateral hips -Monster walks and lateral stepping, GTB at knees/RTB for second lap on 10 foot lap -[employer] with smiley face ball on wall, 1 set of 8 reps bilaterally   03/19/2024  Therapeutic Exercise: -Nustep, 5 minutes, level 6 resistance, pt cued for 70 spm -Leg press, 2 sets of 10 reps, plate 4>5, pt cued for decreased knee valgus -Hamstring curls, 2 sets of 10 reps, plate 4, pt cued for eccentric control Therapeutic Activity: -Sled push/pull, 30lbs, 50 foot laps, pt cued for LE sequencing, 4 laps, pt cued for increased pace -Agility ladder 5 laps, diagonals, hop scotch and grape vine variations, pt cued for sequencing    PATIENT EDUCATION:  Education details: Pt was educated on findings of PT evaluation, prognosis, frequency of therapy visits and rationale, attendance policy, and HEP if given.   Person educated: Patient Education method: Explanation and Verbal cues Education comprehension: verbalized understanding, verbal cues required, and tactile cues required  HOME EXERCISE PROGRAM: Access Code: [email or web address] URL: [email or web address] Date: 02/09/2024 Prepared by: Lang Ada  Exercises - Supine Bridge  - 1 x daily - 7 x weekly - 3 sets - 10 reps - 5 hold - Standing Single Leg Stance with Counter Support  - 1 x daily - 7 x weekly - 1 sets - 3 reps - 30 hold - Sit to Stand with Arms Crossed  - 1 x daily - 7 x weekly - 3 sets - 10 reps  03/07/24:  - Side Stepping with Resistance at Thighs and Counter Support  - 2 x daily - 7 x weekly - 3 sets -  10 reps - Clam with Resistance  - 1 x daily - 7 x weekly - 2 sets - 10 reps - 5 hold  ASSESSMENT:  CLINICAL IMPRESSION: Patient continues to demonstrate decreased LLE strength, decreased gait quality and balance. Pt able to perform advanced SLS activities today with minimal increase in left hip pain. Patient able to continue dynamic balance and core activation exercises today with step up variations and sit to stand variations, good performance with verbal cueing.  Patient would continue to benefit from skilled physical therapy for decreased left hip pain, increased endurance with ambulation, increased LE strength/stability, and improved balance for improved quality of life, improved independence with management of left hip health and continued progress towards therapy goals.      Eval: Patient is a 78 y.o. female who was seen today for physical therapy evaluation and treatment for lt hip pain. Patient demonstrates decreased LE strength, abnormal pain rating, and impaired balance. Patient also demonstrates good performance with ambulation during today's session with good pace observed during with no reproduction of symptoms. Patient does demonstrate reproduction of symptoms during strength testing of hip flexion and abduction as well as tenderness to palpation of greater trochanter/bursae, iliopsoas busra and posterior gluteal musculature. Patient requires education on prognosis of likely contributing factors to left hip pain with OP with flexion ROM and FABER test as well as possible anterior and lateral hip bursitis and  tendonitis of hip flexors and hip abductors. Patient would benefit from skilled physical therapy for decreased left hip pain, increased endurance with ambulation, increased LE strength, and balance for improved gait quality, return to higher level of function with ADLs, and progress towards therapy goals.    OBJECTIVE IMPAIRMENTS: decreased activity tolerance, decreased balance,  decreased endurance, decreased knowledge of use of DME, decreased mobility, difficulty walking, decreased ROM, decreased strength, and pain.   ACTIVITY LIMITATIONS: carrying, lifting, bending, sitting, standing, squatting, sleeping, stairs, transfers, and bed mobility  PARTICIPATION LIMITATIONS: meal prep, laundry, shopping, community activity, and yard work  PERSONAL FACTORS: Age, Fitness, Past/current experiences, Time since onset of injury/illness/exacerbation, and 1 comorbidity: hx of low back pain are also affecting patient's functional outcome.   REHAB POTENTIAL: Fair chronic in nature  CLINICAL DECISION MAKING: Stable/uncomplicated  EVALUATION COMPLEXITY: Low   GOALS: Goals reviewed with patient? No  SHORT TERM GOALS: Target date: 03/01/24  Patient will demonstrate evidence of independence with individualized HEP and will report compliance for at least 3 days per week for optimized progression towards remaining therapy goals. Baseline:  Goal status: INITIAL  2.  Patient will report a decrease in pain level during community ambulation by at least 2 points for improved quality of life. Baseline: 7/10 Goal status: INITIAL     LONG TERM GOALS: Target date: 03/22/24  Pt will demonstrate a an increase of at least 9 points on the LEFS for improved performance of community ambulation and ADL. Baseline: see objective Goal status: INITIAL  2.  Pt will improve 2 MWT by 40 feet in order to demonstrate improved functional ambulatory capacity in community setting.  Baseline: see objective Goal status: INITIAL  3.  Pt will demonstrate WFL pain free ROM (flexion and extension) in left hip, for increased mobility and maximal efficiency of gait cycle during ambulation. Baseline: see objective Goal status: INITIAL  4.  Pt will demonstrate at least 4/5 MMT for right lower extremity for increased strength during ADL and community ambulation. Baseline: see objective Goal status:  INITIAL  5.  Pt will improve 5TSTS by at least 2.3 seconds in order to improve strength during functional activities. Baseline: see objective Goal status: INITIAL    PLAN:  PT FREQUENCY: 1-2x/week  PT DURATION: 4 weeks  PLANNED INTERVENTIONS: 97110-Therapeutic exercises, 97530- Therapeutic activity, 97112- Neuromuscular re-education, 97535- Self Care, [id number]- Manual therapy, [id number]- Gait training, Patient/Family education, Balance training, Stair training, Joint mobilization, DME instructions, Cryotherapy, and Moist heat  PLAN FOR NEXT SESSION:  progress hip and proximal LE strengthening, incorporate balance, manual therapy as needed  Lang Ada, PT, DPT [facility] Office: [phone] 3:50 PM, 04/05/24

## 2024-04-09 ENCOUNTER — Encounter (HOSPITAL_COMMUNITY): Payer: Self-pay

## 2024-04-09 ENCOUNTER — Encounter (HOSPITAL_COMMUNITY)

## 2024-04-12 ENCOUNTER — Encounter (HOSPITAL_COMMUNITY)

## 2024-04-16 ENCOUNTER — Ambulatory Visit (HOSPITAL_COMMUNITY)

## 2024-04-16 ENCOUNTER — Encounter (HOSPITAL_COMMUNITY): Payer: Self-pay

## 2024-04-16 DIAGNOSIS — M25552 Pain in left hip: Secondary | ICD-10-CM | POA: Diagnosis present

## 2024-04-16 DIAGNOSIS — Z7409 Other reduced mobility: Secondary | ICD-10-CM | POA: Diagnosis present

## 2024-04-16 NOTE — Therapy (Signed)
 OUTPATIENT PHYSICAL THERAPY LOWER EXTREMITY TREATMENT   Patient Name: Deborah Stevenson MRN: [id number] DOB:July 07, 1945, 78 y.o., female Today's Date: 04/16/2024  END OF SESSION:  PT End of Session - 04/16/24 1500     Visit Number 9    Number of Visits 12    Date for Recertification  04/26/24    Authorization Type [employer] CHO[employer] PPO    Authorization Time Period cohere approved 10 visits from 02/09/24-05/09/2024    Authorization - Visit Number 8    Authorization - Number of Visits 10    Progress Note Due on Visit 10    PT Start Time 1500    PT Stop Time 1540    PT Time Calculation (min) 40 min    Activity Tolerance Patient tolerated treatment well;Patient limited by pain    Behavior During Therapy [facility] for tasks assessed/performed               Past Medical History:  Diagnosis Date   Depression    Hypertension    Past Surgical History:  Procedure Laterality Date   BREAST EXCISIONAL BIOPSY Left    CESAREAN SECTION     Patient Active Problem List   Diagnosis Date Noted   Acute appendicitis with peritoneal abscess 08/24/2015   Essential hypertension 08/24/2015    PCP: Verena Mems, MD   REFERRING PROVIDER: Reyne Cordella SQUIBB, MD  REFERRING DIAG: lt hip pain  THERAPY DIAG:  Pain in left hip  Impaired functional mobility, balance, and endurance  Rationale for Evaluation and Treatment: Rehabilitation  ONSET DATE: over 6 months  SUBJECTIVE:   SUBJECTIVE STATEMENT: Pt states hip is feeling okay but still has occasional pain in it with SLS on that side. Pt states it has gotten much better. Pt states the HEP is going well.  Eval: Pt states she has had therapy before for low back pain, therapy helped. Pt states she has bumped the left hip but can not say what started the left hip pain, it comes and goes, was a 7/10 this last weekend. Pt states she is fine most mornings but by the afternoon the left hip is hurting. Pt states she was able to walk 2  miles everyday in the Spring earlier this year and has not been able to do that due to increased pain. Pt states she has MRI scheduled for left hip soon. Pt reports she does low back exercises, some at least everyday.  PERTINENT HISTORY: Osteoporosis PAIN:  Are you having pain? Yes: NPRS scale: 7/10 Pain location: left lateral hip and bilateral quad Pain description: achey, soreness Aggravating factors: laying on left side, walking a long time, sitting a long time, stairs Relieving factors: ice, tylenol , rest  PRECAUTIONS: None  RED FLAGS: None   WEIGHT BEARING RESTRICTIONS: No  FALLS:  Has patient fallen in last 6 months? No  LIVING ENVIRONMENT: Lives with: lives with their spouse Lives in: House/apartment Stairs: Yes: External: 4 steps; on right going up Has following equipment at home: pt states she uses a walking sticks for long walks  OCCUPATION: school teacher  PLOF: Independent  PATIENT GOALS: decrease pain in left, increase walking tolerance, strengthen LEs  NEXT MD VISIT: MRI next Thursday  OBJECTIVE:  Note: Objective measures were completed at Evaluation unless otherwise noted.  DIAGNOSTIC FINDINGS: None  PATIENT SURVEYS:  LEFS : 37 / 80 = 46.3 %   COGNITION: Overall cognitive status: Within functional limits for tasks assessed     SENSATION: WFL  EDEMA:  None reported   PALPATION: Tenderness to palpation to anterior lateral thigh more on left side. Most tenderness noted on left greater trochanter  LOWER EXTREMITY ROM:  Active ROM Right eval Left eval  Hip flexion [facility] WFL, pain with OP  Hip extension    Hip abduction    Hip adduction    Hip internal rotation [facility]  Hip external rotation 43 35, pain  Knee flexion    Knee extension    Ankle dorsiflexion    Ankle plantarflexion    Ankle inversion    Ankle eversion     (Blank rows = not tested)  LOWER EXTREMITY MMT:  MMT Right eval Left eval Right 03/07/24 Left 03/07/24  Hip  flexion 4 3+, pain    Hip extension   4- 4-  Hip abduction 4 3+, pain    Hip adduction 4 3+    Hip internal rotation   4-/5 3+  Hip external rotation   3+ 3/5 *  Knee flexion      Knee extension      Ankle dorsiflexion      Ankle plantarflexion      Ankle inversion      Ankle eversion       (Blank rows = not tested)  *=pain  LOWER EXTREMITY SPECIAL TESTS:  Hip special tests: Belvie (FABER) test: positive   FUNCTIONAL TESTS:  5 times sit to stand: 15.5 seconds, no increased pain 2 minute walk test: 591 feet, no increased hip pain reported SLS 02/09/24/25: R: 11.49 sec L: 24.02 sec  GAIT: Distance walked: 650 feet Assistive device utilized: None Level of assistance: Complete Independence Comments: Pt demonstrates minimal compensations in gait cycle during with no increase in hip pain reported, pt states it takes about a mile for pain to come on. Pt demonstrates slight toe whip of  RLE and flat foot more pronounced on the R foot as well.                                                                                                                                 TREATMENT DATE:  04/16/2024  Therapeutic Exercise: -Elliptical, 2.5 minutes, level 1 resistance, pt cued for pain free ROM, SOB noted Neuromuscular Re-education: -Hamstring curls, plate 4>5 with eccentric control of LLE only, 2 sets of 10 reps -Leg press, 2 sets of 10 reps, plate 2>3, single leg, pt cued for no knee locking, eccentric control -Captain morgans with smiley face ball on wall, 1 set of 8 reps bilaterally -Single leg Romanian dead lift, 2 set of 5 reps bilaterally, pt cued for decreased UE support Therapeutic Activity: -Monster walks and lateral stepping and bwd, GTB at ankles 10 step laps, 2 laps each variation -Sled push/pull 30 feet, 2 laps, pt cued for increased hip ROM -Walking marches/heel raises, 2 laps 35 feet, 10 lb kettle bell in RUE  04/05/2024  Therapeutic Exercise: -Nustep, 5 minutes,  level  7 resistance, pt cued for 80 spm  Neuromuscular Re-education: -Hip hikes, 1 set of 10 reps, pt cued for decreased UE support -Lateral lunges with towel slide, 1 set of 6 reps bilaterally, pt cued to remain in pain free ROM, performed in parallel bars -Single leg Romanian dead lift, 1 set of 8 reps bilaterally, performed in parallel bars, pt cued for decreased UE support on last 3 reps Therapeutic Activity: -Sit to stands with 10 lb KB at chest, 2 sets of 5 reps, pt cued for core activation and staggered LE stance -Step up and overs, 6 inch step and blue foam, 1 bout of 5 reps, 4lb ankle weights, 10lb KB last 2 bouts, march each time on elevated surface   03/29/2024  Manual Therapy: -Hip distraction, gait belt method utilized, multiple planes -PROM with end range of motion gentle grade I and II mobilizations -PA grade II and III mobilizations with knee in 90 degrees of flexion Therapeutic Exercise: -Nustep, 3 minutes, level 6 resistance, pt cued for 80 spm -Standing 3 way kick, RTB at ankles pt cued for segmental movements and max pain free ROM, 1 set of 10 reps bilaterally -Clamshells, 2 sets of 10 reps, GTB at knees -Windshield wipers for hip IR/ER, 2 sets of 10 reps, pt cued for stretch of bilateral hips -Monster walks and lateral stepping, GTB at knees/RTB for second lap on 10 foot lap -[employer] with smiley face ball on wall, 1 set of 8 reps bilaterally  PATIENT EDUCATION:  Education details: Pt was educated on findings of PT evaluation, prognosis, frequency of therapy visits and rationale, attendance policy, and HEP if given.   Person educated: Patient Education method: Explanation and Verbal cues Education comprehension: verbalized understanding, verbal cues required, and tactile cues required  HOME EXERCISE PROGRAM: Access Code: [email or web address] URL: [email or web address] Date: 02/09/2024 Prepared by: Lang Ada  Exercises - Supine Bridge  - 1 x daily -  7 x weekly - 3 sets - 10 reps - 5 hold - Standing Single Leg Stance with Counter Support  - 1 x daily - 7 x weekly - 1 sets - 3 reps - 30 hold - Sit to Stand with Arms Crossed  - 1 x daily - 7 x weekly - 3 sets - 10 reps  03/07/24:  - Side Stepping with Resistance at Thighs and Counter Support  - 2 x daily - 7 x weekly - 3 sets - 10 reps - Clam with Resistance  - 1 x daily - 7 x weekly - 2 sets - 10 reps - 5 hold  ASSESSMENT:  CLINICAL IMPRESSION: Patient continues to demonstrate increased LE strength, increased gait quality and fair balance. Patient also demonstrates decreased endurance with elliptical aerobic based exercise during today's session. Patient able to continue dynamic balance and core activation exercises today with farmer carry variation and resisted walking, good performance with verbal cueing. Patient would continue to benefit from skilled physical therapy for decreased hip pain, increased endurance with ambulation, increased LE strength, and improved balance for increased quality of life, improved independence with management of left hip and continued progress towards therapy goals.      Eval: Patient is a 78 y.o. female who was seen today for physical therapy evaluation and treatment for lt hip pain. Patient demonstrates decreased LE strength, abnormal pain rating, and impaired balance. Patient also demonstrates good performance with ambulation during today's session with good pace observed during with no reproduction of symptoms. Patient does demonstrate reproduction of symptoms  during strength testing of hip flexion and abduction as well as tenderness to palpation of greater trochanter/bursae, iliopsoas busra and posterior gluteal musculature. Patient requires education on prognosis of likely contributing factors to left hip pain with OP with flexion ROM and FABER test as well as possible anterior and lateral hip bursitis and tendonitis of hip flexors and hip abductors.  Patient would benefit from skilled physical therapy for decreased left hip pain, increased endurance with ambulation, increased LE strength, and balance for improved gait quality, return to higher level of function with ADLs, and progress towards therapy goals.    OBJECTIVE IMPAIRMENTS: decreased activity tolerance, decreased balance, decreased endurance, decreased knowledge of use of DME, decreased mobility, difficulty walking, decreased ROM, decreased strength, and pain.   ACTIVITY LIMITATIONS: carrying, lifting, bending, sitting, standing, squatting, sleeping, stairs, transfers, and bed mobility  PARTICIPATION LIMITATIONS: meal prep, laundry, shopping, community activity, and yard work  PERSONAL FACTORS: Age, Fitness, Past/current experiences, Time since onset of injury/illness/exacerbation, and 1 comorbidity: hx of low back pain are also affecting patient's functional outcome.   REHAB POTENTIAL: Fair chronic in nature  CLINICAL DECISION MAKING: Stable/uncomplicated  EVALUATION COMPLEXITY: Low   GOALS: Goals reviewed with patient? No  SHORT TERM GOALS: Target date: 03/01/24  Patient will demonstrate evidence of independence with individualized HEP and will report compliance for at least 3 days per week for optimized progression towards remaining therapy goals. Baseline:  Goal status: INITIAL  2.  Patient will report a decrease in pain level during community ambulation by at least 2 points for improved quality of life. Baseline: 7/10 Goal status: INITIAL     LONG TERM GOALS: Target date: 03/22/24  Pt will demonstrate a an increase of at least 9 points on the LEFS for improved performance of community ambulation and ADL. Baseline: see objective Goal status: INITIAL  2.  Pt will improve 2 MWT by 40 feet in order to demonstrate improved functional ambulatory capacity in community setting.  Baseline: see objective Goal status: INITIAL  3.  Pt will demonstrate WFL pain free  ROM (flexion and extension) in left hip, for increased mobility and maximal efficiency of gait cycle during ambulation. Baseline: see objective Goal status: INITIAL  4.  Pt will demonstrate at least 4/5 MMT for right lower extremity for increased strength during ADL and community ambulation. Baseline: see objective Goal status: INITIAL  5.  Pt will improve 5TSTS by at least 2.3 seconds in order to improve strength during functional activities. Baseline: see objective Goal status: INITIAL    PLAN:  PT FREQUENCY: 1-2x/week  PT DURATION: 4 weeks  PLANNED INTERVENTIONS: 97110-Therapeutic exercises, 97530- Therapeutic activity, 97112- Neuromuscular re-education, 97535- Self Care, [id number]- Manual therapy, [phone]- Gait training, Patient/Family education, Balance training, Stair training, Joint mobilization, DME instructions, Cryotherapy, and Moist heat  PLAN FOR NEXT SESSION:  progress hip and proximal LE strengthening, incorporate balance, manual therapy as needed  Lang Ada, PT, DPT [facility] [place] Office: [phone] 3:47 PM, 04/16/24

## 2024-04-19 ENCOUNTER — Encounter (HOSPITAL_COMMUNITY): Payer: Self-pay

## 2024-04-19 ENCOUNTER — Encounter (HOSPITAL_COMMUNITY)

## 2024-04-26 ENCOUNTER — Ambulatory Visit (HOSPITAL_COMMUNITY)

## 2024-04-26 ENCOUNTER — Encounter (HOSPITAL_COMMUNITY): Payer: Self-pay

## 2024-04-26 DIAGNOSIS — Z7409 Other reduced mobility: Secondary | ICD-10-CM

## 2024-04-26 DIAGNOSIS — M25552 Pain in left hip: Secondary | ICD-10-CM

## 2024-04-26 NOTE — Therapy (Signed)
 OUTPATIENT PHYSICAL THERAPY LOWER EXTREMITY TREATMENT  PHYSICAL THERAPY DISCHARGE SUMMARY  Visits from Start of Care: 9  Current functional level related to goals / functional outcomes: WFL   Remaining deficits: LLE Strength   Education / Equipment: Role of PT, importance of HEP compliance, referral process   Patient agrees to discharge. Patient goals were partially met. Patient is being discharged due to being pleased with the current functional level.  Patient Name: Deborah Stevenson MRN: [id number] DOB:01-10-1946, 78 y.o., female Today's Date: 04/26/2024  END OF SESSION:  PT End of Session - 04/26/24 0905     Visit Number 10    Number of Visits 12    Date for Recertification  04/26/24    Authorization Type [employer] CHO[employer] PPO    Authorization Time Period cohere approved 10 visits from 02/09/24-05/09/2024    Authorization - Visit Number 9    Authorization - Number of Visits 10    Progress Note Due on Visit 10    PT Start Time 0905    PT Stop Time 0937    PT Time Calculation (min) 32 min    Activity Tolerance Patient tolerated treatment well;Patient limited by pain    Behavior During Therapy [facility] for tasks assessed/performed                Past Medical History:  Diagnosis Date   Depression    Hypertension    Past Surgical History:  Procedure Laterality Date   BREAST EXCISIONAL BIOPSY Left    CESAREAN SECTION     Patient Active Problem List   Diagnosis Date Noted   Acute appendicitis with peritoneal abscess 08/24/2015   Essential hypertension 08/24/2015    PCP: Verena Mems, MD   REFERRING PROVIDER: Reyne Cordella SQUIBB, MD  REFERRING DIAG: lt hip pain  THERAPY DIAG:  Pain in left hip  Impaired functional mobility, balance, and endurance  Rationale for Evaluation and Treatment: Rehabilitation  ONSET DATE: over 6 months  SUBJECTIVE:   SUBJECTIVE STATEMENT: Pt states she has seen a big improvement in hip since start of therapy.  Pt states R knee acted up after popping moving a heavy bag, but is fine today. Pt states she is happy with current level of hip function, moving with no pain.   Eval: Pt states she has had therapy before for low back pain, therapy helped. Pt states she has bumped the left hip but can not say what started the left hip pain, it comes and goes, was a 7/10 this last weekend. Pt states she is fine most mornings but by the afternoon the left hip is hurting. Pt states she was able to walk 2 miles everyday in the Spring earlier this year and has not been able to do that due to increased pain. Pt states she has MRI scheduled for left hip soon. Pt reports she does low back exercises, some at least everyday.  PERTINENT HISTORY: Osteoporosis PAIN:  Are you having pain? Yes: NPRS scale: 7/10 Pain location: left lateral hip and bilateral quad Pain description: achey, soreness Aggravating factors: laying on left side, walking a long time, sitting a long time, stairs Relieving factors: ice, tylenol , rest  PRECAUTIONS: None  RED FLAGS: None   WEIGHT BEARING RESTRICTIONS: No  FALLS:  Has patient fallen in last 6 months? No  LIVING ENVIRONMENT: Lives with: lives with their spouse Lives in: House/apartment Stairs: Yes: External: 4 steps; on right going up Has following equipment at home: pt states she uses a  walking sticks for long walks  OCCUPATION: school teacher  PLOF: Independent  PATIENT GOALS: decrease pain in left, increase walking tolerance, strengthen LEs  NEXT MD VISIT: MRI next Thursday  OBJECTIVE:  Note: Objective measures were completed at Evaluation unless otherwise noted.  DIAGNOSTIC FINDINGS: None  PATIENT SURVEYS:  LEFS : 37 / 80 = 46.3 %   LEFS:  78 / 80 = 97.5 % COGNITION: Overall cognitive status: Within functional limits for tasks assessed     SENSATION: WFL  EDEMA:  None reported   PALPATION: Tenderness to palpation to anterior lateral thigh more on left  side. Most tenderness noted on left greater trochanter  LOWER EXTREMITY ROM:  Active ROM Right eval Left eval  Hip flexion [facility] WFL, pain with OP  Hip extension    Hip abduction    Hip adduction    Hip internal rotation [facility]  Hip external rotation 43 35, pain  Knee flexion    Knee extension    Ankle dorsiflexion    Ankle plantarflexion    Ankle inversion    Ankle eversion     (Blank rows = not tested)  LOWER EXTREMITY MMT:  MMT Right eval Left eval Right 03/07/24 Left 03/07/24 Left 04/26/24  Hip flexion 4 3+, pain   4  Hip extension   4- 4- 4  Hip abduction 4 3+, pain     Hip adduction 4 3+   4-  Hip internal rotation   4-/5 3+   Hip external rotation   3+ 3/5 *   Knee flexion       Knee extension       Ankle dorsiflexion       Ankle plantarflexion       Ankle inversion       Ankle eversion        (Blank rows = not tested)  *=pain  LOWER EXTREMITY SPECIAL TESTS:  Hip special tests: Belvie (FABER) test: positive   FUNCTIONAL TESTS:  5 times sit to stand: 15.5 seconds, no increased pain 2 minute walk test: 591 feet, no increased hip pain reported SLS 02/09/24/25: R: 11.49 sec L: 24.02 sec  04/26/24 5TSTS: 9.43 seconds, no increased pain : 634 feet, no increased pain in the hip   GAIT: Distance walked: 650 feet Assistive device utilized: None Level of assistance: Complete Independence Comments: Pt demonstrates minimal compensations in gait cycle during with no increase in hip pain reported, pt states it takes about a mile for pain to come on. Pt demonstrates slight toe whip of  RLE and flat foot more pronounced on the R foot as well.                                                                                                                                 TREATMENT DATE:  04/26/2024  Discharge note: goals tracked, , 5TSTS, LEFS, strength/ROM assessed  04/16/2024  Therapeutic Exercise: -Elliptical, 2.5  minutes, level 1 resistance, pt  cued for pain free ROM, SOB noted Neuromuscular Re-education: -Hamstring curls, plate 4>5 with eccentric control of LLE only, 2 sets of 10 reps -Leg press, 2 sets of 10 reps, plate 2>3, single leg, pt cued for no knee locking, eccentric control -Captain morgans with smiley face ball on wall, 1 set of 8 reps bilaterally -Single leg Romanian dead lift, 2 set of 5 reps bilaterally, pt cued for decreased UE support Therapeutic Activity: -Monster walks and lateral stepping and bwd, GTB at ankles 10 step laps, 2 laps each variation -Sled push/pull 30 feet, 2 laps, pt cued for increased hip ROM -Walking marches/heel raises, 2 laps 35 feet, 10 lb kettle bell in RUE  04/05/2024  Therapeutic Exercise: -Nustep, 5 minutes, level 7 resistance, pt cued for 80 spm  Neuromuscular Re-education: -Hip hikes, 1 set of 10 reps, pt cued for decreased UE support -Lateral lunges with towel slide, 1 set of 6 reps bilaterally, pt cued to remain in pain free ROM, performed in parallel bars -Single leg Romanian dead lift, 1 set of 8 reps bilaterally, performed in parallel bars, pt cued for decreased UE support on last 3 reps Therapeutic Activity: -Sit to stands with 10 lb KB at chest, 2 sets of 5 reps, pt cued for core activation and staggered LE stance -Step up and overs, 6 inch step and blue foam, 1 bout of 5 reps, 4lb ankle weights, 10lb KB last 2 bouts, march each time on elevated surface   PATIENT EDUCATION:  Education details: Pt was educated on findings of PT evaluation, prognosis, frequency of therapy visits and rationale, attendance policy, and HEP if given.   Person educated: Patient Education method: Explanation and Verbal cues Education comprehension: verbalized understanding, verbal cues required, and tactile cues required  HOME EXERCISE PROGRAM: Access Code: [email or web address] URL: [email or web address] Date: 04/26/2024 Prepared by: Lang Ada  Exercises - Standing Single Leg Stance with  Counter Support  - 1 x daily - 7 x weekly - 1 sets - 3 reps - 30 hold - Sit to Stand with Arms Crossed  - 1 x daily - 7 x weekly - 3 sets - 10 reps - Side Stepping with Resistance at Thighs and Counter Support  - 2 x daily - 7 x weekly - 3 sets - 10 reps - Clam with Resistance  - 1 x daily - 7 x weekly - 2 sets - 10 reps - 5 hold - Supine Bridge with Resistance Band  - 1 x daily - 7 x weekly - 3 sets - 10 reps - 5 hold - Forward Monster Walk with Resistance at Thighs and Counter Support  - 1 x daily - 7 x weekly - 3 sets - 10 reps - Kettlebell Swing  - 1 x daily - 7 x weekly - 3 sets - 10 reps - Single Leg Stance with Support  - 1 x daily - 7 x weekly - 1 sets - 3 reps - 30 hold  ASSESSMENT:  CLINICAL IMPRESSION: Patient continues to demonstrate slight deficits LLE strength, increased gait quality and balance. Patient also demonstrates improved ROM with L hip, only slight pain with end range ER. Pt demonstrates above average performance on increased endurance apparent. Patient able to meet all rehab goals except strength in LLE, HEP revised to address independently. Patient to be discharged this date due to being please with current level of function and meeting majority of rehab goals.       OBJECTIVE  IMPAIRMENTS: decreased activity tolerance, decreased balance, decreased endurance, decreased knowledge of use of DME, decreased mobility, difficulty walking, decreased ROM, decreased strength, and pain.   ACTIVITY LIMITATIONS: carrying, lifting, bending, sitting, standing, squatting, sleeping, stairs, transfers, and bed mobility  PARTICIPATION LIMITATIONS: meal prep, laundry, shopping, community activity, and yard work  PERSONAL FACTORS: Age, Fitness, Past/current experiences, Time since onset of injury/illness/exacerbation, and 1 comorbidity: hx of low back pain are also affecting patient's functional outcome.   REHAB POTENTIAL: Fair chronic in nature  CLINICAL DECISION MAKING:  Stable/uncomplicated  EVALUATION COMPLEXITY: Low   GOALS: Goals reviewed with patient? No  SHORT TERM GOALS: Target date: 03/01/24  Patient will demonstrate evidence of independence with individualized HEP and will report compliance for at least 3 days per week for optimized progression towards remaining therapy goals. Baseline:  Goal status: MET  2.  Patient will report a decrease in pain level during community ambulation by at least 2 points for improved quality of life. Baseline: 7/10 Goal status: MET     LONG TERM GOALS: Target date: 03/22/24  Pt will demonstrate a an increase of at least 9 points on the LEFS for improved performance of community ambulation and ADL. Baseline: see objective Goal status: MET  2.  Pt will improve 2 MWT by 40 feet in order to demonstrate improved functional ambulatory capacity in community setting.  Baseline: see objective Goal status: MET  3.  Pt will demonstrate WFL pain free ROM (flexion and extension) in left hip, for increased mobility and maximal efficiency of gait cycle during ambulation. Baseline: see objective Goal status: MET  4.  Pt will demonstrate at least 4/5 MMT for left lower extremity for increased strength during ADL and community ambulation. Baseline: see objective Goal status: IN PROGRESS  5.  Pt will improve 5TSTS by at least 2.3 seconds in order to improve strength during functional activities. Baseline: see objective Goal status: MET    PLAN:  PT FREQUENCY: 1-2x/week  PT DURATION: 4 weeks  PLANNED INTERVENTIONS: 97110-Therapeutic exercises, 97530- Therapeutic activity, 97112- Neuromuscular re-education, 97535- Self Care, [id number]- Manual therapy, [phone]- Gait training, Patient/Family education, Balance training, Stair training, Joint mobilization, DME instructions, Cryotherapy, and Moist heat  PLAN FOR NEXT SESSION:  discharged  Lang Ada, PT, DPT [facility] Office: [phone] 9:47 AM, 04/26/24
